# Patient Record
Sex: Female | Born: 1950
Health system: Southern US, Community
[De-identification: ages and names within clinical notes are randomized; demographics above are authoritative.]

## PROBLEM LIST (undated history)

## (undated) DIAGNOSIS — I700 Atherosclerosis of aorta: Secondary | ICD-10-CM

## (undated) DIAGNOSIS — I2510 Atherosclerotic heart disease of native coronary artery without angina pectoris: Secondary | ICD-10-CM

## (undated) DIAGNOSIS — G720 Drug-induced myopathy: Secondary | ICD-10-CM

## (undated) DIAGNOSIS — E785 Hyperlipidemia, unspecified: Secondary | ICD-10-CM

## (undated) DIAGNOSIS — I071 Rheumatic tricuspid insufficiency: Secondary | ICD-10-CM

## (undated) DIAGNOSIS — Z9889 Other specified postprocedural states: Secondary | ICD-10-CM

## (undated) DIAGNOSIS — I371 Nonrheumatic pulmonary valve insufficiency: Secondary | ICD-10-CM

## (undated) DIAGNOSIS — B001 Herpesviral vesicular dermatitis: Secondary | ICD-10-CM

## (undated) DIAGNOSIS — T466X5A Adverse effect of antihyperlipidemic and antiarteriosclerotic drugs, initial encounter: Secondary | ICD-10-CM

## (undated) DIAGNOSIS — R002 Palpitations: Secondary | ICD-10-CM

## (undated) DIAGNOSIS — I219 Acute myocardial infarction, unspecified: Secondary | ICD-10-CM

## (undated) DIAGNOSIS — R112 Nausea with vomiting, unspecified: Secondary | ICD-10-CM

## (undated) DIAGNOSIS — R053 Chronic cough: Secondary | ICD-10-CM

## (undated) DIAGNOSIS — H409 Unspecified glaucoma: Secondary | ICD-10-CM

## (undated) HISTORY — DX: Unspecified glaucoma: H40.9

## (undated) HISTORY — PX: OTHER SURGICAL HISTORY: SHX169

## (undated) HISTORY — PX: OOPHORECTOMY: SHX86

## (undated) HISTORY — PX: EYE SURGERY: SHX253

## (undated) HISTORY — PX: ABDOMINAL HYSTERECTOMY: SHX81

## (undated) HISTORY — DX: Herpesviral vesicular dermatitis: B00.1

## (undated) HISTORY — PX: CATARACT EXTRACTION, BILATERAL: SHX1313

## (undated) HISTORY — PX: TUBAL LIGATION: SHX77

## (undated) HISTORY — PX: CATARACT EXTRACTION W/ INTRAOCULAR LENS  IMPLANT, BILATERAL: SHX1307

## (undated) HISTORY — DX: Acute myocardial infarction, unspecified: I21.9

## (undated) HISTORY — PX: FRACTURE SURGERY: SHX138

---

## 2005-02-03 ENCOUNTER — Ambulatory Visit: Payer: Self-pay | Admitting: Family Medicine

## 2005-02-18 ENCOUNTER — Ambulatory Visit: Payer: Self-pay | Admitting: Family Medicine

## 2005-04-13 ENCOUNTER — Ambulatory Visit: Payer: Self-pay | Admitting: Family Medicine

## 2005-10-15 ENCOUNTER — Emergency Department: Payer: Self-pay | Admitting: Emergency Medicine

## 2005-10-15 ENCOUNTER — Other Ambulatory Visit: Payer: Self-pay

## 2005-10-16 ENCOUNTER — Ambulatory Visit: Payer: Self-pay | Admitting: Emergency Medicine

## 2006-03-12 ENCOUNTER — Ambulatory Visit: Payer: Self-pay | Admitting: Obstetrics and Gynecology

## 2007-03-15 ENCOUNTER — Ambulatory Visit: Payer: Self-pay | Admitting: Specialist

## 2007-05-25 ENCOUNTER — Ambulatory Visit: Payer: Self-pay | Admitting: Family Medicine

## 2008-05-31 ENCOUNTER — Ambulatory Visit: Payer: Self-pay | Admitting: Family Medicine

## 2008-07-26 ENCOUNTER — Ambulatory Visit: Payer: Self-pay | Admitting: Gastroenterology

## 2009-06-05 ENCOUNTER — Ambulatory Visit: Payer: Self-pay | Admitting: Family Medicine

## 2010-06-12 ENCOUNTER — Ambulatory Visit: Payer: Self-pay | Admitting: Family Medicine

## 2011-11-12 ENCOUNTER — Ambulatory Visit: Payer: Self-pay | Admitting: Family Medicine

## 2012-08-24 ENCOUNTER — Ambulatory Visit: Payer: Self-pay | Admitting: Obstetrics and Gynecology

## 2012-08-24 LAB — BASIC METABOLIC PANEL
BUN: 9 mg/dL (ref 7–18)
Calcium, Total: 9.4 mg/dL (ref 8.5–10.1)
Chloride: 107 mmol/L (ref 98–107)
Creatinine: 0.58 mg/dL — ABNORMAL LOW (ref 0.60–1.30)
EGFR (Non-African Amer.): >60
Glucose: 90 mg/dL (ref 65–99)
Potassium: 4.9 mmol/L (ref 3.5–5.1)
Sodium: 141 mmol/L (ref 136–145)

## 2012-08-24 LAB — CBC
HCT: 39.8 % (ref 35.0–47.0)
MCV: 92 fL (ref 80–100)
RBC: 4.31 10*6/uL (ref 3.80–5.20)
RDW: 12.9 % (ref 11.5–14.5)
WBC: 5.1 10*3/uL (ref 3.6–11.0)

## 2012-09-05 ENCOUNTER — Ambulatory Visit: Payer: Self-pay | Admitting: Obstetrics and Gynecology

## 2012-09-06 LAB — CREATININE, SERUM
Creatinine: 0.59 mg/dL — ABNORMAL LOW (ref 0.60–1.30)
EGFR (African American): >60
EGFR (Non-African Amer.): >60

## 2012-09-08 LAB — PATHOLOGY REPORT

## 2012-11-16 ENCOUNTER — Ambulatory Visit: Payer: Self-pay | Admitting: Family Medicine

## 2015-04-16 NOTE — Op Note (Signed)
PATIENT NAME:  Ashlee Mccarthy, Ashlee Mccarthy MR#:  [id number] DATE OF BIRTH:  1951/01/04  DATE OF PROCEDURE:  09/05/2012  PREOPERATIVE DIAGNOSES:  1. Uterine prolapse, second degree.  2. Cystocele, second-degree.  3. Rectocele mild.   POSTOPERATIVE DIAGNOSES:  1. Uterine prolapse, second degree.  2. Cystocele, second-degree.  3. Rectocele mild.   OPERATIVE PROCEDURES: Transvaginal hysterectomy, bilateral salpingo-oophorectomy with anterior colporrhaphy.   SURGEON: Alanda Slim. Ayriana Wix, MD  FIRST ASSISTANT: None.   ANESTHESIA: General endotracheal.   INDICATIONS: The patient is a 64 year old white female, para 2-0-0-2, with symptomatic pelvic relaxation who desires definitive surgery. She desires ovaries to be removed at the time of surgery as well.   FINDINGS AT SURGERY: Findings at surgery revealed the uterine prolapse and cystocele. There was a mild to moderate rectocele which was not repaired because the patient was asymptomatic. The patient's tubes and ovaries were grossly normal. There was a left pelvic infundibulopelvic bleeder that had to be controlled with additional suture.   DESCRIPTION OF PROCEDURE: The patient was brought to the Operating Room where she was placed in supine position. General endotracheal anesthesia was induced without difficulty. She was placed in the dorsal lithotomy position using the candy cane stirrups. A perineal, intravaginal prep and drape was performed in the standard fashion. Foley catheter was placed and was draining clear yellow urine from the bladder. A weighted speculum was placed into the vagina and double-tooth tenaculum was placed on the cervix. Posterior colpotomy was made with Mayo scissors. The uterosacral ligaments were clamped, cut, and stick tied using 0 Vicryl. These were tagged. Cervix was circumscribed and the bladder and vaginal mucosa were dissected off the lower uterine segment through sharp and blunt dissection. Eventually the anterior  cul-de-sac was entered. The cardinal broad ligament complexes were then clamped, cut, and stick tied sequentially. The level of the utero-ovarian ligaments, the pedicles were clamped and the uterus was removed from the operative field. The ovary and tubes were isolated with Babcock clamp and the curved Heaney clamp was used to crossclamp the infundibulopelvic ligament. The adnexal structures were excised. The infundibulopelvic ligament was doubly ligated using 0 Vicryl suture. First suture was a free tie followed by a stick tie. There was some persistent bleeding from the IP pedicle on the left and this required repeat suturing with 0 Vicryl figure-of-eight stitch. This provided adequate hemostasis. The peritoneum was then reapproximated using a pursestring stitch of 0 Vicryl. This was followed by the anterior colporrhaphy. Allis Adair clamps were used to retraction. The vaginal mucosa was incised in the midline and sequentially the perivesical fascia was dissected off the vaginal mucosa through sharp and blunt dissection. Once adequately mobilized, the cystocele was reduced using vertical mattress sutures of 0 Vicryl. The excess vaginal mucosa was trimmed and then reapproximated in the midline using 2-0 chromic simple interrupted sutures. Following completion of the repair the vagina was packed with Kerlix with Premarin cream. Patient was then awakened, extubated, and taken to recovery room in satisfactory condition. Estimated blood loss was 250 mL. IV fluids were 1400 mL. All instruments, needle and sponge counts were verified as correct. The patient did receive Ancef antibiotic prophylaxis.  ____________________________ Alanda Slim Amedio Bowlby, MD mad:cms D: 09/05/2012 14:29:05 ET T: 09/05/2012 16:22:42 ET JOB#: [id number]  cc: Hassell Done A. Ariam Mol, MD, <Dictator>  Alanda Slim Virgen Belland MD ELECTRONICALLY SIGNED 09/05/2012 16:41

## 2015-04-16 NOTE — H&P (Signed)
PATIENT NAME:  Ashlee Mccarthy, NEWSON MR#:  [id number] DATE OF BIRTH:  19-Jul-1951  DATE OF ADMISSION:  09/05/2012  PREOPERATIVE DIAGNOSES:  1. Uterine prolapse.  2. Cystocele.   HISTORY: Ashlee Mccarthy is a 64 year old white female, para 2-0-0-2, on no hormone replacement therapy, who presents for surgical management of symptomatic pelvic relaxation. Patient has uterine prolapse and cystocele and desires definitive surgery through [facility]. She also would like anterior colporrhaphy. She does have a mild posterior wall defect but she is asymptomatic and therefore this will not be corrected.   PAST MEDICAL HISTORY/CHRONIC ILLNESSES: Denied.   PAST SURGICAL HISTORY:  1. Wrist fracture repair 1999.  2. Bilateral tubal ligation 1980.  3. Dilation and curettage.   PAST OB HISTORY: Para 2-0-0-2, spontaneous vaginal delivery x2 with largest being 8 pounds, 1 ounce.   FAMILY HISTORY: Negative for cancer of the colon. There is ovarian cancer in a half sister. There is breast cancer in three maternal aunts. There is heart disease in several family members. There is no history of diabetes mellitus.   SOCIAL HISTORY: Patient does not smoke, does not drink, does not use drugs. She currently is a Regulatory affairs officer.   DRUG ALLERGIES: None.   CURRENT MEDICATIONS: Estrace cream vaginal biweekly.   REVIEW OF SYSTEMS: Patient denies recent illness. She denies history of coagulopathy. She denies reactive airway disease.   PHYSICAL EXAMINATION:  VITAL SIGNS: Height 64 inches, weight 139 pounds, body mass index 24, heart rate 73, blood pressure 152/84.   GENERAL: Patient is a pleasant well-appearing white female with normal affect. She is alert and oriented.   OROPHARYNX: Clear.   NECK: Supple. There is no thyromegaly or adenopathy.   LUNGS: Clear.   HEART: Regular rate and rhythm without murmur.   ABDOMEN: Soft, nontender. No organomegaly. No hernia.   PELVIC: External genitalia normal. BUS normal.  Vagina has fair estrogen effect. There is a second-degree cystocele present. There is mild uterine prolapse noted. Small rectocele is noted.  EXTREMITIES: Without clubbing, cyanosis, or edema.   SKIN: Without rash.   MUSCULOSKELETAL: Exam is normal.   IMPRESSION:  1. Second degree cystocele.  2. Uterine prolapse.  3. Mild rectocele.   PLAN: [facility], BSO with anterior colporrhaphy; date of surgery is 09/05/2012.    CONSENT NOTE: Patient is to undergo surgery for SPR on 09/05/2012. She is understanding of the planned procedure and is aware of and is accepting of all surgical risks which include but are not limited to bleeding, infection, pelvic organ injury with need for repair, blood clot disorders, and anesthesia risks . Informed consent is given. Patient is ready and willing to proceed with surgery as scheduled.  ____________________________ Alanda Slim Shaquoya Cosper, MD mad:cms D: 09/05/2012 11:59:48 ET T: 09/05/2012 12:20:11 ET JOB#: [id number]  cc: Hassell Done A. Marnita Poirier, MD, <Dictator>  Alanda Slim Ajanae Virag MD ELECTRONICALLY SIGNED 09/05/2012 16:41

## 2015-07-04 ENCOUNTER — Encounter: Payer: Self-pay | Admitting: Family Medicine

## 2016-03-02 ENCOUNTER — Ambulatory Visit: Payer: Self-pay | Admitting: Family Medicine

## 2016-03-12 ENCOUNTER — Ambulatory Visit: Payer: Self-pay | Admitting: Family Medicine

## 2016-09-16 ENCOUNTER — Encounter: Payer: Self-pay | Admitting: Family Medicine

## 2016-09-16 ENCOUNTER — Ambulatory Visit (INDEPENDENT_AMBULATORY_CARE_PROVIDER_SITE_OTHER): Payer: Self-pay | Admitting: Family Medicine

## 2016-09-16 ENCOUNTER — Other Ambulatory Visit: Payer: Self-pay | Admitting: Family Medicine

## 2016-09-16 VITALS — BP 118/78 | HR 91 | Temp 97.9°F | Resp 18 | Ht 64.0 in | Wt 144.2 lb

## 2016-09-16 DIAGNOSIS — R300 Dysuria: Secondary | ICD-10-CM

## 2016-09-16 DIAGNOSIS — R350 Frequency of micturition: Secondary | ICD-10-CM

## 2016-09-16 DIAGNOSIS — R42 Dizziness and giddiness: Secondary | ICD-10-CM

## 2016-09-16 DIAGNOSIS — Z23 Encounter for immunization: Secondary | ICD-10-CM | POA: Diagnosis not present

## 2016-09-16 DIAGNOSIS — Z90710 Acquired absence of both cervix and uterus: Secondary | ICD-10-CM | POA: Diagnosis not present

## 2016-09-16 DIAGNOSIS — E2839 Other primary ovarian failure: Secondary | ICD-10-CM | POA: Diagnosis not present

## 2016-09-16 DIAGNOSIS — Z1211 Encounter for screening for malignant neoplasm of colon: Secondary | ICD-10-CM

## 2016-09-16 DIAGNOSIS — B001 Herpesviral vesicular dermatitis: Secondary | ICD-10-CM | POA: Diagnosis not present

## 2016-09-16 DIAGNOSIS — Z13220 Encounter for screening for lipoid disorders: Secondary | ICD-10-CM

## 2016-09-16 DIAGNOSIS — Z1159 Encounter for screening for other viral diseases: Secondary | ICD-10-CM

## 2016-09-16 DIAGNOSIS — Z1239 Encounter for other screening for malignant neoplasm of breast: Secondary | ICD-10-CM

## 2016-09-16 DIAGNOSIS — F4321 Adjustment disorder with depressed mood: Secondary | ICD-10-CM | POA: Diagnosis not present

## 2016-09-16 DIAGNOSIS — R55 Syncope and collapse: Secondary | ICD-10-CM

## 2016-09-16 DIAGNOSIS — Z131 Encounter for screening for diabetes mellitus: Secondary | ICD-10-CM | POA: Diagnosis not present

## 2016-09-16 DIAGNOSIS — R232 Flushing: Secondary | ICD-10-CM

## 2016-09-16 DIAGNOSIS — N951 Menopausal and female climacteric states: Secondary | ICD-10-CM | POA: Diagnosis not present

## 2016-09-16 DIAGNOSIS — R5383 Other fatigue: Secondary | ICD-10-CM

## 2016-09-16 NOTE — Progress Notes (Signed)
Name: Ashlee Mccarthy   MRN: [id number]    DOB: January 06, 1951   Date:09/16/2016       Progress Note  Subjective  Chief Complaint  Chief Complaint  Patient presents with  . Annual Exam  . Urinary Tract Infection    for about a week    HPI  Urinary symptoms: she has noticed over the past few weeks intermittent symptoms of dysuria, urinary frequency and nocturia also hesitancy, improves with increase in water intake and also cranberry juice but not completely resolved.   Syncope: she has noticed four episodes of dizziness, lightheadedness, last episode happened at home in the evening. She only ate a bowel cereal for breakfast and that evening felt dizzy, sat down on her bed and briefly seemed to have lost consciousness ( based on time on her clock ), she recalls events prior, no weakness following episode , she ate and felt better, once at work after drinking a slushy. Not associated with spinning sensation or hearing changes. No loss of bowel or bladder control. Symptoms started after death of her husband 2.5 months ago  Grieving: husband of 8 years and he was a smoker and had PVD, he had a foot amputation and died after the procedure, she is still grieving, she has gained weight since he died, she still crying, she works and has family support locally. No problem sleeping at this time   Patient Active Problem List   Diagnosis Date Noted  . Fever blister 09/16/2016  . History of hysterectomy for benign disease 09/16/2016  . Menopause syndrome 09/16/2016    No past surgical history on file.  No family history on file.  Social History   Social History  . Marital status: Married    Spouse name: N/A  . Number of children: N/A  . Years of education: N/A   Occupational History  . Not on file.   Social History Main Topics  . Smoking status: Never Smoker  . Smokeless tobacco: Never Used  . Alcohol use No  . Drug use: No  . Sexual activity: No   Other Topics Concern  . Not on  file   Social History Narrative  . No narrative on file     Current Outpatient Prescriptions:  .  valACYclovir (VALTREX) 500 MG tablet, Take 500 mg by mouth 2 (two) times daily., Disp: , Rfl:   Allergies  Allergen Reactions  . Penicillins Rash     ROS  Ten systems reviewed and is negative except as mentioned in HPI   Objective  Vitals:   09/16/16 1400  BP: 118/78  Pulse: 91  Resp: 18  Temp: 97.9 F (36.6 C)  SpO2: 98%  Weight: 144 lb 4 oz (65.4 kg)  Height: 5\' 4"  (1.626 m)    Body mass index is 24.76 kg/m.  Physical Exam  Constitutional: Patient appears well-developed and well-nourished. Obese  No distress.  HEENT: head atraumatic, normocephalic, pupils equal and reactive to light, , neck supple, throat within normal limits Cardiovascular: Normal rate, regular rhythm and normal heart sounds.  No murmur heard. No BLE edema. Pulmonary/Chest: Effort normal and breath sounds normal. No respiratory distress. Abdominal: Soft.  There is no tenderness. Negative CVA tenderness Psychiatric: Patient has a normal mood and affect. behavior is normal. Judgment and thought content normal. Neurological: AAO, no focal findings, no nystagmus  PHQ2/9: Depression screen PHQ 2/9 09/16/2016  Decreased Interest 0  Down, Depressed, Hopeless 0  PHQ - 2 Score 0  Fall Risk: Fall Risk  09/16/2016  Falls in the past year? No     Functional Status Survey: Is the patient deaf or have difficulty hearing?: No Does the patient have difficulty seeing, even when wearing glasses/contacts?: No Does the patient have difficulty concentrating, remembering, or making decisions?: No Does the patient have difficulty walking or climbing stairs?: No Does the patient have difficulty dressing or bathing?: No Does the patient have difficulty doing errands alone such as visiting a doctor's office or shopping?: No    Assessment & Plan  1. Urinary frequency  - CULTURE, URINE  COMPREHENSIVE  2. Dysuria  - CULTURE, URINE COMPREHENSIVE  3. Lipid screening  - Lipid panel  4. Diabetes mellitus screening  - Hemoglobin A1c  5. Fever blister   6. History of hysterectomy for benign disease   7. Hot flashes  Check labs  8. Breast cancer screening  - MM Digital Screening; Future  9. Colon cancer screening  - Ambulatory referral to Gastroenterology  10. Grieving  Dicussed grieve counseling  37. Ovarian failure  - DG Bone Density; Future  12. Need for Tdap vaccination  - Tdap vaccine greater than or equal to 7yo IM  13. Needs flu shot  - Flu Vaccine QUAD 36+ mos IM  14. Need for hepatitis C screening test  - Hepatitis C antibody  15. Other fatigue  - CBC with Differential/Platelet - COMPLETE METABOLIC PANEL WITH GFR - TSH - Vitamin B12 - VITAMIN D 25 Hydroxy (Vit-D Deficiency, Fractures)  16. Dizziness  Explained importance of not skipping meals, we will check labs and if continues to occur refer to neurologist  17. Near syncope  - CBC with Differential/Platelet - COMPLETE METABOLIC PANEL WITH GFR - TSH - Vitamin B12 - VITAMIN D 25 Hydroxy (Vit-D Deficiency, Fractures)

## 2016-09-17 LAB — COMPLETE METABOLIC PANEL WITH GFR
ALT: 13 U/L (ref 6–29)
AST: 22 U/L (ref 10–35)
Albumin: 4.6 g/dL (ref 3.6–5.1)
Alkaline Phosphatase: 56 U/L (ref 33–130)
BUN: 10 mg/dL (ref 7–25)
CALCIUM: 9.8 mg/dL (ref 8.6–10.4)
CHLORIDE: 104 mmol/L (ref 98–110)
CO2: 26 mmol/L (ref 20–31)
CREATININE: 0.61 mg/dL (ref 0.50–0.99)
GFR, Est African American: >89 mL/min (ref >=60)
GFR, Est Non African American: >89 mL/min (ref >=60)
GLUCOSE: 77 mg/dL (ref 65–99)
POTASSIUM: 4.4 mmol/L (ref 3.5–5.3)
SODIUM: 140 mmol/L (ref 135–146)
Total Bilirubin: 1 mg/dL (ref 0.2–1.2)
Total Protein: 6.8 g/dL (ref 6.1–8.1)

## 2016-09-17 LAB — CBC WITH DIFFERENTIAL/PLATELET
BASOS ABS: 55 {cells}/uL (ref 0–200)
Basophils Relative: 1 %
EOS ABS: 220 {cells}/uL (ref 15–500)
EOS PCT: 4 %
HEMATOCRIT: 40.2 % (ref 35.0–45.0)
HEMOGLOBIN: 13.5 g/dL (ref 11.7–15.5)
LYMPHS ABS: 2090 {cells}/uL (ref 850–3900)
Lymphocytes Relative: 38 %
MCH: 31.1 pg (ref 27.0–33.0)
MCHC: 33.6 g/dL (ref 32.0–36.0)
MCV: 92.6 fL (ref 80.0–100.0)
MPV: 11.6 fL (ref 7.5–12.5)
Monocytes Absolute: 550 cells/uL (ref 200–950)
Monocytes Relative: 10 %
NEUTROS PCT: 47 %
Neutro Abs: 2585 cells/uL (ref 1500–7800)
Platelets: 261 10*3/uL (ref 140–400)
RBC: 4.34 MIL/uL (ref 3.80–5.10)
RDW: 13 % (ref 11.0–15.0)
WBC: 5.5 10*3/uL (ref 3.8–10.8)

## 2016-09-17 LAB — TSH: TSH: 0.87 m[IU]/L

## 2016-09-17 LAB — LIPID PANEL
CHOL/HDL RATIO: 2.6 ratio (ref <=5.0)
CHOLESTEROL: 192 mg/dL (ref 125–200)
HDL: 73 mg/dL (ref >=46)
LDL CALC: 113 mg/dL (ref <130)
Triglycerides: 28 mg/dL (ref <150)
VLDL: 6 mg/dL (ref <30)

## 2016-09-17 LAB — HEMOGLOBIN A1C
HEMOGLOBIN A1C: 5 % (ref <5.7)
Mean Plasma Glucose: 97 mg/dL

## 2016-09-17 LAB — HEPATITIS C ANTIBODY: HCV Ab: NEGATIVE

## 2016-09-17 LAB — VITAMIN B12: VITAMIN B 12: 307 pg/mL (ref 200–1100)

## 2016-09-17 LAB — VITAMIN D 25 HYDROXY (VIT D DEFICIENCY, FRACTURES): Vit D, 25-Hydroxy: 36 ng/mL (ref 30–100)

## 2016-09-19 LAB — CULTURE, URINE COMPREHENSIVE

## 2016-09-20 ENCOUNTER — Other Ambulatory Visit: Payer: Self-pay | Admitting: Family Medicine

## 2016-09-20 MED ORDER — NITROFURANTOIN MONOHYD MACRO 100 MG PO CAPS: 100.0000 mg | ORAL_CAPSULE | Freq: Two times a day (BID) | ORAL | 0 refills | Status: DC

## 2016-10-13 ENCOUNTER — Encounter: Payer: Self-pay | Admitting: Family Medicine

## 2016-10-13 ENCOUNTER — Ambulatory Visit (INDEPENDENT_AMBULATORY_CARE_PROVIDER_SITE_OTHER): Payer: PRIVATE HEALTH INSURANCE | Admitting: Family Medicine

## 2016-10-13 VITALS — BP 118/84 | HR 98 | Temp 98.1°F | Resp 16 | Ht 64.0 in | Wt 143.4 lb

## 2016-10-13 DIAGNOSIS — Z2911 Encounter for prophylactic immunotherapy for respiratory syncytial virus (RSV): Secondary | ICD-10-CM | POA: Diagnosis not present

## 2016-10-13 DIAGNOSIS — Z23 Encounter for immunization: Secondary | ICD-10-CM

## 2016-10-13 DIAGNOSIS — Z01419 Encounter for gynecological examination (general) (routine) without abnormal findings: Secondary | ICD-10-CM | POA: Diagnosis not present

## 2016-10-13 DIAGNOSIS — Z1211 Encounter for screening for malignant neoplasm of colon: Secondary | ICD-10-CM

## 2016-10-13 DIAGNOSIS — Z124 Encounter for screening for malignant neoplasm of cervix: Secondary | ICD-10-CM | POA: Diagnosis not present

## 2016-10-13 NOTE — Progress Notes (Signed)
Name: Ashlee Mccarthy   MRN: [id number]    DOB: [birth year]   Date:10/13/2016       Progress Note  Subjective  Chief Complaint  Chief Complaint  Patient presents with  . Annual Exam    HPI  Well Woman: she has family history of ovarian , colon and breast cancer, discussed hereditary cancer syndromes and she will think about it. No bladder symptoms, not sexually active ( widow )   Patient Active Problem List   Diagnosis Date Noted  . Fever blister 09/16/2016  . History of hysterectomy for benign disease 09/16/2016  . Menopause syndrome 09/16/2016    Past Surgical History:  Procedure Laterality Date  . ABDOMINAL HYSTERECTOMY    . bladder tack    . TUBAL LIGATION      Family History  Problem Relation Age of Onset  . Cirrhosis Mother   . Lung cancer Father   . Ovarian cancer Sister   . Colon cancer Maternal Aunt   . Colon cancer Maternal Uncle     Social History   Social History  . Marital status: Married    Spouse name: N/A  . Number of children: N/A  . Years of education: N/A   Occupational History  . Not on file.   Social History Main Topics  . Smoking status: Never Smoker  . Smokeless tobacco: Never Used  . Alcohol use No  . Drug use: No  . Sexual activity: No   Other Topics Concern  . 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     Current Outpatient Prescriptions:  .  valACYclovir (VALTREX) 500 MG tablet, Take 500 mg by mouth 2 (two) times daily., Disp: , Rfl:   Allergies  Allergen Reactions  . Penicillins Rash     ROS   Constitutional: Negative for fever or weight change.  Respiratory: Negative for cough and shortness of breath.   Cardiovascular: Negative for chest pain or palpitations.  Gastrointestinal: Negative for abdominal pain, no  bowel changes.  Musculoskeletal: Negative for gait problem or joint swelling.  Skin: Negative for rash.  Neurological: Negative for dizziness or headache.  No other specific complaints in a complete review of systems (except as listed in HPI above).   Objective  Vitals:   10/13/16 1147  BP: 118/84  Pulse: 98  Resp: 16  Temp: 98.1 F (36.7 C)  SpO2: 97%  Weight: 143 lb 7 oz (65.1 kg)  Height: 5' 4" (1.626 m)    Body mass index is 24.62 kg/m.  Physical Exam  Constitutional: Patient appears well-developed and well-nourished. No distress.  HENT: Head: Normocephalic and atraumatic. Ears: B TMs ok, no erythema or effusion; Nose: Nose normal. Mouth/Throat: Oropharynx is clear and moist. No oropharyngeal exudate.  Eyes: Conjunctivae and EOM are normal. Pupils are equal, round, and reactive to light. No scleral icterus.  Neck: Normal range of motion. Neck supple. No JVD present. No thyromegaly present.  Cardiovascular: Normal rate, regular rhythm and normal heart sounds.  No murmur heard. No BLE edema. Pulmonary/Chest: Effort normal and breath sounds normal. No respiratory distress. Abdominal: Soft. Bowel sounds are normal, no distension. There is no tenderness. no masses Breast: no lumps or masses, no nipple discharge or rashes FEMALE GENITALIA:  External genitalia normal  External urethra normal Vaginal vault with mild atrophy, without discharge or lesions Cervix absent Bimanual exam normal without masses RECTAL: normal external exam Musculoskeletal: Normal range of motion, no joint effusions. No gross deformities Neurological: he is alert and oriented to person, place, and time. No cranial nerve deficit. Coordination, balance, strength, speech and gait are normal.  Skin: Skin is warm and dry. No rash noted. No erythema.  Psychiatric: Patient has a normal mood and affect. behavior is normal. Judgment and thought content normal.  Recent Results (from the past 2160 hour(s))   COMPLETE METABOLIC PANEL WITH GFR     Status: None   Collection Time: 09/16/16 12:01 AM  Result Value Ref Range   Sodium 140 135 - 146 mmol/L   Potassium 4.4 3.5 - 5.3 mmol/L   Chloride 104 98 - 110 mmol/L   CO2 26 20 - 31 mmol/L   Glucose, Bld 77 65 - 99 mg/dL   BUN 10 7 - 25 mg/dL   Creat 0.61 0.50 - 0.99 mg/dL    Comment:   For patients > or = 63 years of age: The upper reference limit for Creatinine is approximately 13% higher for people identified as African-American.      Total Bilirubin 1.0 0.2 - 1.2 mg/dL   Alkaline Phosphatase 56 33 - 130 U/L   AST 22 10 - 35 U/L   ALT 13 6 - 29 U/L   Total Protein 6.8 6.1 - 8.1 g/dL   Albumin 4.6 3.6 - 5.1 g/dL   Calcium 9.8 8.6 - 10.4 mg/dL   GFR, Est African American >89 >=60 mL/min   GFR, Est Non African American >89 >=60 mL/min  CBC with Differential/Platelet     Status: None   Collection Time: 09/16/16 12:01 AM  Result Value Ref Range   WBC 5.5 3.8 - 10.8 K/uL   RBC 4.34 3.80 - 5.10 MIL/uL   Hemoglobin 13.5 11.7 - 15.5 g/dL   HCT 40.2 35.0 - 45.0 %   MCV 92.6 80.0 - 100.0 fL   MCH 31.1 27.0 - 33.0 pg   MCHC 33.6 32.0 - 36.0 g/dL   RDW 13.0 11.0 - 15.0 %   Platelets 261 140 - 400 K/uL   MPV 11.6 7.5 - 12.5 fL   Neutro Abs 2,585 1,500 - 7,800 cells/uL   Lymphs Abs 2,090 850 - 3,900 cells/uL   Monocytes Absolute 550 200 - 950 cells/uL   Eosinophils Absolute 220 15 - 500 cells/uL   Basophils Absolute 55 0 - 200 cells/uL   Neutrophils Relative % 47 %   Lymphocytes Relative 38 %   Monocytes Relative 10 %   Eosinophils Relative 4 %   Basophils Relative 1 %   Smear Review Criteria for review not met   Lipid panel     Status: None   Collection Time: 09/16/16 12:01 AM  Result Value Ref Range   Cholesterol 192 125 - 200 mg/dL   Triglycerides 28 <150 mg/dL   HDL 73 >=46 mg/dL   Total CHOL/HDL Ratio 2.6 <=5.0 Ratio   VLDL 6 <30 mg/dL   LDL Cholesterol 113 <130 mg/dL    Comment:   Total Cholesterol/HDL Ratio:CHD Risk                         Coronary Heart Disease Risk Table  Men       Women          1/2 Average Risk              3.4        3.3              Average Risk              5.0        4.4           2X Average Risk              9.6        7.1           3X Average Risk             23.4       11.0 Use the calculated Patient Ratio above and the CHD Risk table  to determine the patient's CHD Risk.   TSH     Status: None   Collection Time: 09/16/16 12:01 AM  Result Value Ref Range   TSH 0.87 mIU/L    Comment:   Reference Range   > or = 20 Years  0.40-4.50   Pregnancy Range First trimester  0.26-2.66 Second trimester 0.55-2.73 Third trimester  0.43-2.91     Vitamin B12     Status: None   Collection Time: 09/16/16 12:01 AM  Result Value Ref Range   Vitamin B-12 307 200 - 1,100 pg/mL  Hemoglobin A1c     Status: None   Collection Time: 09/16/16 12:01 AM  Result Value Ref Range   Hgb A1c MFr Bld 5.0 <5.7 %    Comment:   For the purpose of screening for the presence of diabetes:   <5.7%       Consistent with the absence of diabetes 5.7-6.4 %   Consistent with increased risk for diabetes (prediabetes) >=6.5 %     Consistent with diabetes   This assay result is consistent with a decreased risk of diabetes.   Currently, no consensus exists regarding use of hemoglobin A1c for diagnosis of diabetes in children.   According to American Diabetes Association (ADA) guidelines, hemoglobin A1c <7.0% represents optimal control in non-pregnant diabetic patients. Different metrics may apply to specific patient populations. Standards of Medical Care in Diabetes (ADA).      Mean Plasma Glucose 97 mg/dL  Hepatitis C antibody     Status: None   Collection Time: 09/16/16 12:01 AM  Result Value Ref Range   HCV Ab NEGATIVE NEGATIVE    Comment: NR=NOT REPORTABLE,SEE COMMENT ORAL therapy:A cefazolin MIC of <32 predicts  susceptibility to the oral agents  cefaclor, cefdinir,cefpodoxime,cefprozil,cefuroxime, cephalexin,and loracarbef when used for therapy  of uncomplicated UTIs due to E.coli,K.pneumomiae, and P.mirabilis. PARENTERAL therapy: A cefazolin MIC of >8 indicates resistance to parenteral cefazolin. An alternate test method must be performed to confirm susceptibility to parenteral cefazolin.   CULTURE, URINE COMPREHENSIVE     Status: None   Collection Time: 09/16/16 12:01 AM  Result Value Ref Range   Colony Count 10,000-50,000 CFU/mL    Organism ID, Bacteria ESCHERICHIA COLI       Susceptibility   Escherichia coli -  (no method available)    AMPICILLIN <=2 Sensitive     AMOX/CLAVULANIC <=2 Sensitive     AMPICILLIN/SULBACTAM <=2 Sensitive     PIP/TAZO <=4 Sensitive     IMIPENEM <=0.25 Sensitive     CEFAZOLIN <=4 Not Reportable  CEFTRIAXONE <=1 Sensitive     CEFTAZIDIME <=1 Sensitive     CEFEPIME <=1 Sensitive     GENTAMICIN <=1 Sensitive     TOBRAMYCIN <=1 Sensitive     CIPROFLOXACIN <=0.25 Sensitive     LEVOFLOXACIN <=0.12 Sensitive     NITROFURANTOIN <=16 Sensitive     TRIMETH/SULFA <=20 Sensitive   VITAMIN D 25 Hydroxy (Vit-D Deficiency, Fractures)     Status: None   Collection Time: 09/16/16 12:01 AM  Result Value Ref Range   Vit D, 25-Hydroxy 36 30 - 100 ng/mL    Comment: Vitamin D Status           25-OH Vitamin D        Deficiency                <20 ng/mL        Insufficiency         20 - 29 ng/mL        Optimal             > or = 30 ng/mL   For 25-OH Vitamin D testing on patients on D2-supplementation and patients for whom quantitation of D2 and D3 fractions is required, the [facility] 25-OH VIT D, (D2,D3), LC/MS/MS is recommended: order code [id number] (patients > 2 yrs).     PHQ2/9: Depression screen [facility] 2/9 10/13/2016 09/16/2016  Decreased Interest 0 0  Down, Depressed, Hopeless 0 0  PHQ - 2 Score 0 0     Fall Risk: Fall Risk  10/13/2016 09/16/2016  Falls in the past year? No No      Functional Status Survey: Is the patient deaf or have difficulty hearing?: No Does the patient have difficulty seeing, even when wearing glasses/contacts?: No Does the patient have difficulty concentrating, remembering, or making decisions?: No Does the patient have difficulty walking or climbing stairs?: No Does the patient have difficulty dressing or bathing?: No Does the patient have difficulty doing errands alone such as visiting a doctor's office or shopping?: No    Assessment & Plan  1. Well woman exam  Discussed hereditary cancer syndromes, she will find out about coverage with her insurance and also think if she wants to have it done  2. Cervical cancer screening  No need, absent cervix  3. Colon cancer screening  Referral already done  4. Need for shingles vaccine  - Varicella-zoster vaccine subcutaneous

## 2016-10-19 ENCOUNTER — Ambulatory Visit: Payer: PRIVATE HEALTH INSURANCE | Admitting: Family Medicine

## 2016-11-23 ENCOUNTER — Ambulatory Visit
Admission: RE | Admit: 2016-11-23 | Discharge: 2016-11-23 | Disposition: A | Discharge status: Home / Self Care | Payer: 59 | Source: Ambulatory Visit | Attending: Family Medicine | Admitting: Family Medicine

## 2016-11-23 DIAGNOSIS — M810 Age-related osteoporosis without current pathological fracture: Secondary | ICD-10-CM | POA: Diagnosis not present

## 2016-11-23 DIAGNOSIS — Z1231 Encounter for screening mammogram for malignant neoplasm of breast: Secondary | ICD-10-CM | POA: Diagnosis not present

## 2016-11-23 DIAGNOSIS — Z780 Asymptomatic menopausal state: Secondary | ICD-10-CM | POA: Diagnosis not present

## 2016-11-23 DIAGNOSIS — E2839 Other primary ovarian failure: Secondary | ICD-10-CM | POA: Insufficient documentation

## 2016-11-25 ENCOUNTER — Encounter: Payer: Self-pay | Admitting: Family Medicine

## 2016-11-25 DIAGNOSIS — M810 Age-related osteoporosis without current pathological fracture: Secondary | ICD-10-CM | POA: Insufficient documentation

## 2016-12-15 ENCOUNTER — Ambulatory Visit: Payer: PRIVATE HEALTH INSURANCE | Admitting: Family Medicine

## 2017-09-19 IMAGING — MG MM DIGITAL SCREENING BILAT W/ CAD
4 series · 4 of 4 positions shown · non-contrast
Comparison: Previous exam(s).

CLINICAL DATA: Screening.

EXAM:
DIGITAL SCREENING BILATERAL MAMMOGRAM WITH CAD

[R CC]
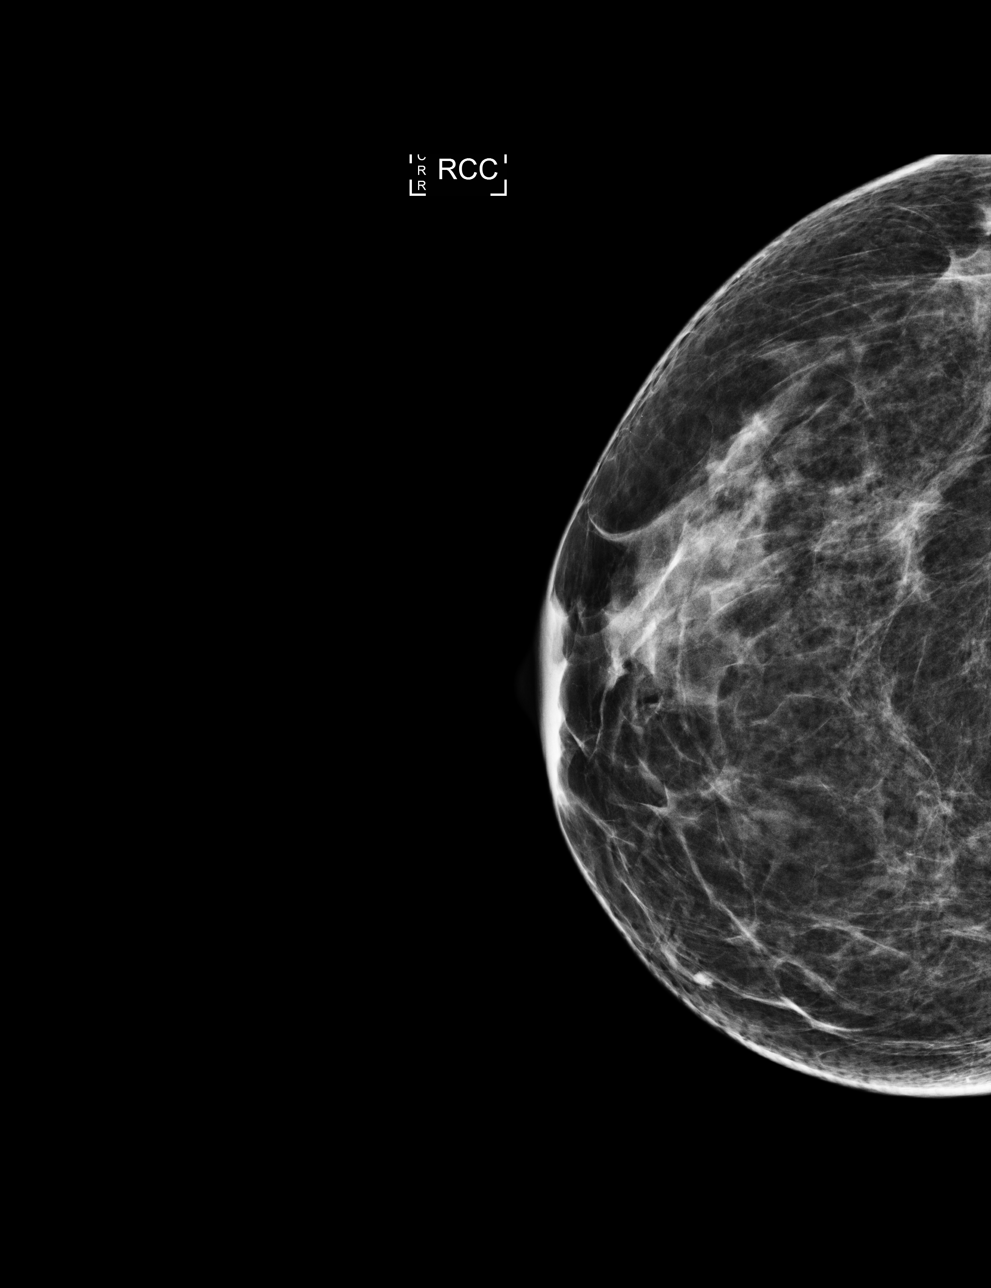

[L MLO]
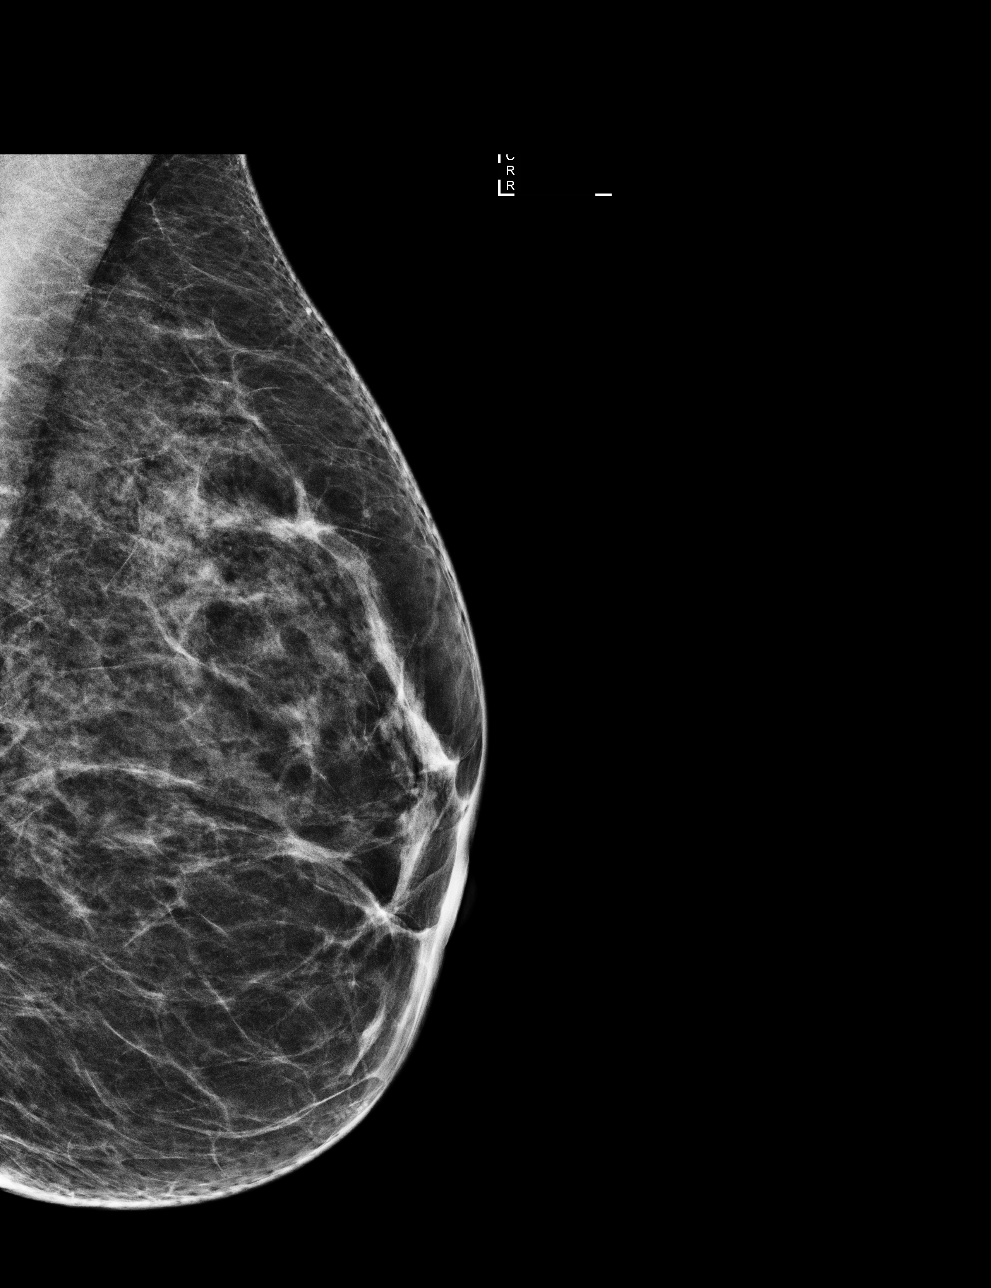

[L CC]
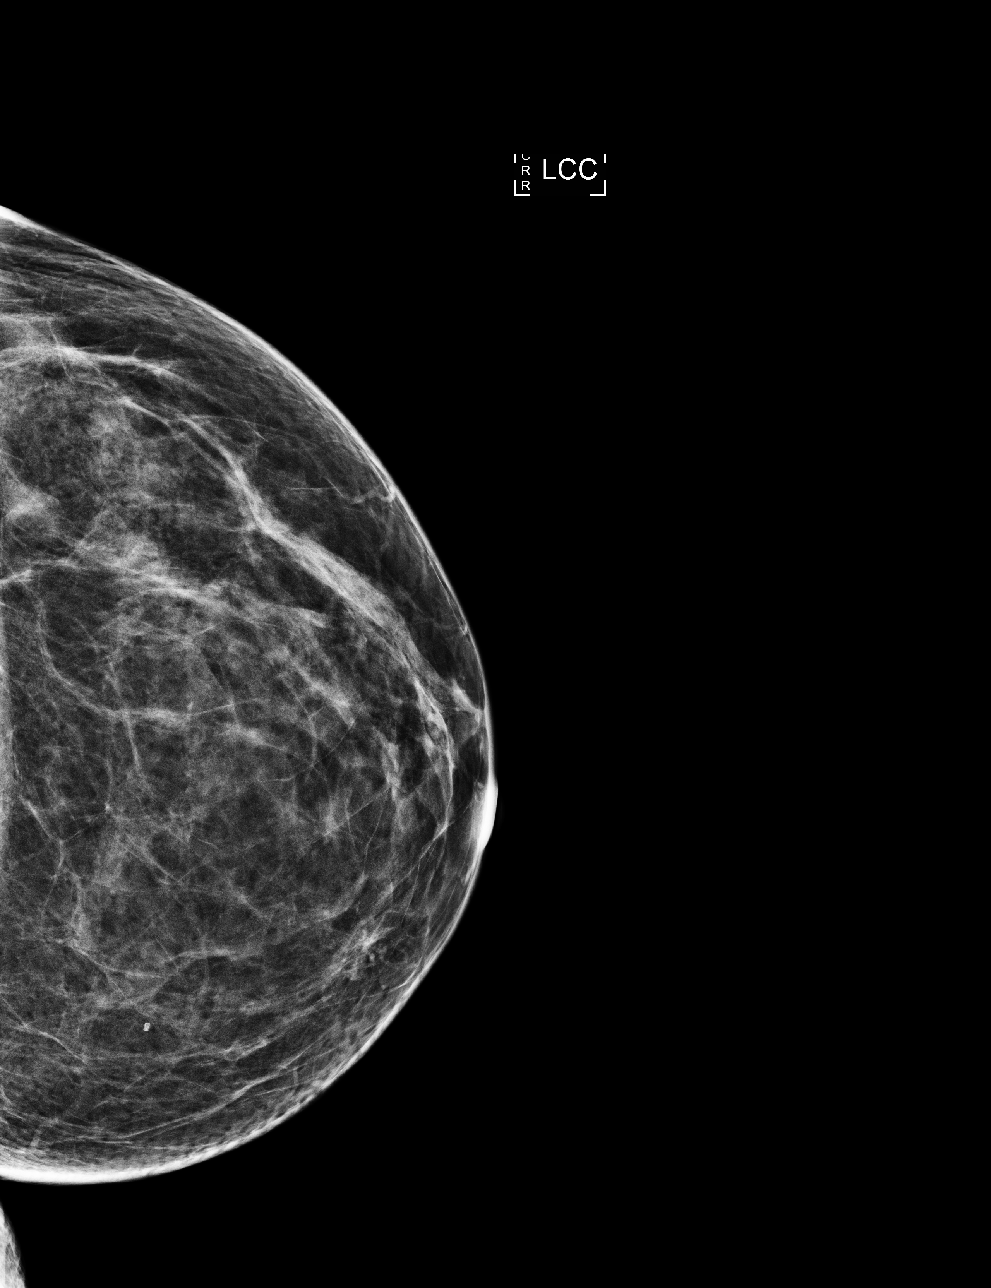

[R MLO]
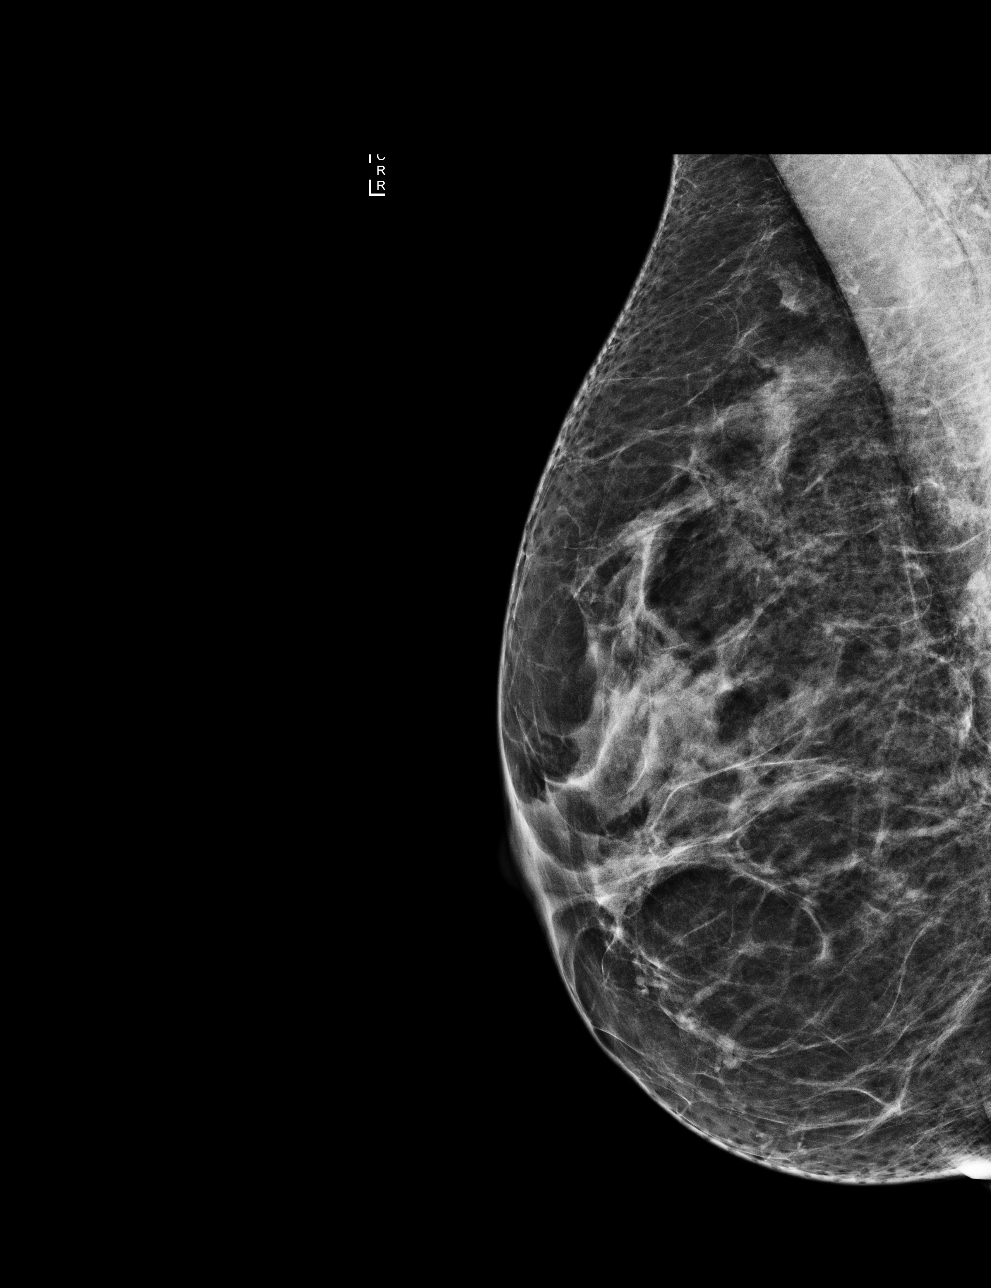

[4 of 4 positions shown; findings below may reference images not displayed]

ACR Breast Density Category b: There are scattered areas of
fibroglandular density.
FINDINGS: There are no findings suspicious for malignancy. Images were
processed with CAD.
IMPRESSION: No mammographic evidence of malignancy. A result letter of this
screening mammogram will be mailed directly to the patient.

RECOMMENDATION:
Screening mammogram in one year. (Code:AS-G-LCT)

BI-RADS CATEGORY  1: Negative.

## 2017-09-27 ENCOUNTER — Ambulatory Visit (INDEPENDENT_AMBULATORY_CARE_PROVIDER_SITE_OTHER): Payer: PRIVATE HEALTH INSURANCE | Admitting: Family Medicine

## 2017-09-27 ENCOUNTER — Encounter: Payer: Self-pay | Admitting: Family Medicine

## 2017-09-27 VITALS — BP 122/70 | HR 96 | Temp 98.1°F | Resp 16 | Ht 64.0 in | Wt 143.8 lb

## 2017-09-27 DIAGNOSIS — Z114 Encounter for screening for human immunodeficiency virus [HIV]: Secondary | ICD-10-CM

## 2017-09-27 DIAGNOSIS — Z1231 Encounter for screening mammogram for malignant neoplasm of breast: Secondary | ICD-10-CM

## 2017-09-27 DIAGNOSIS — Z13220 Encounter for screening for lipoid disorders: Secondary | ICD-10-CM

## 2017-09-27 DIAGNOSIS — M810 Age-related osteoporosis without current pathological fracture: Secondary | ICD-10-CM

## 2017-09-27 DIAGNOSIS — Z1239 Encounter for other screening for malignant neoplasm of breast: Secondary | ICD-10-CM

## 2017-09-27 DIAGNOSIS — Z0000 Encounter for general adult medical examination without abnormal findings: Secondary | ICD-10-CM

## 2017-09-27 MED ORDER — VITAMIN D 1000 UNITS PO TABS: 1000.0000 [IU] | ORAL_TABLET | Freq: Every day | ORAL | 0 refills | Status: DC

## 2017-09-27 NOTE — Progress Notes (Addendum)
Name: Ashlee Mccarthy   MRN: [id number]    DOB: 1951/05/08   Date:09/27/2017       Progress Note  Subjective  Chief Complaint  Chief Complaint  Patient presents with  . Annual Exam    HPI  Patient presents for annual CPE - she has been doing well and is not taking any medications at this time.  Diet: Skips breakfast or has cereal/oatmeal, lunch is usually grilled chicken salad, dinner usually late at night and green beans, grilled chicken, salad, etc.  She does eat ice cream or or chocolate at night before bed. Exercise:  Rides the bicycle and lifts light weights at the gym >3-4 times a week.  Occupation: Works at [employer] part time, [employer] daily.  USPSTF grade A and B recommendations  Depression: Husband passed away 1.5 years ago, stays busy, has adult children and grandchildren that keep her occupied.  Sundays are the hardest days, she does not want counseling. Depression screen [facility] 2/9 10/13/2016 09/16/2016  Decreased Interest 0 0  Down, Depressed, Hopeless 0 0  PHQ - 2 Score 0 0   Hypertension: BP Readings from Last 3 Encounters:  09/27/17 122/70  10/13/16 118/84  09/16/16 118/78   Obesity: Wt Readings from Last 3 Encounters:  09/27/17 143 lb 12.8 oz (65.2 kg)  10/13/16 143 lb 7 oz (65.1 kg)  09/16/16 144 lb 4 oz (65.4 kg)   BMI Readings from Last 3 Encounters:  09/27/17 24.68 kg/m  10/13/16 24.62 kg/m  09/16/16 24.76 kg/m    Alcohol: Hardly ever - has not drank in over 1.5 years Tobacco use: Never ser HIV, hep C: Hep C negative; will check HIV today STD testing and prevention (chl/gon/syphilis): Declines; she does note that she has been sexually active but does not want testing. Intimate partner violence: No concerns Sexual History/Pain during Intercourse: No pain or bleeding with intercourse. Menstrual History/LMP: Hysterectomy Incontinence Symptoms: None.  Advanced Care Planning: A voluntary discussion about advance care planning including the  explanation and discussion of advance directives.  Discussed health care proxy and Living will, and the patient was able to identify a health care proxy as [place].  Patient does not have a living will at present time. If patient does have living will, I have requested they bring this to the clinic to be scanned in to their chart.  Breast cancer: Mammogram due after 11/24/2017, order placed.  3 Aunts on Mother's side have had breast cancer in their 40's and 60's.  Pt's mother and sisters have not had breast cancer No results found for: [facility]  BRCA gene screening: No genetic testing was performed/ Cervical cancer screening: Hysterectomy   Osteoporosis: Was diagnosed at osteoporosis 2017, but never returned to discuss results - she states that she is not interested in medications at this time though this is stressed as important to rebuild bone mass. She is willing to have additional labs drawn today.  Does ride the bicycle and lifts light weights at the gym >3-4 times a week. Takes vitamin D Supplement daily  Mother and two sisters with osteoporosis. No results found for: HMDEXASCAN  Fall prevention/vitamin D: Taking Vitamin D Supplementation; Has railing in all staircases, no loose throw rugs, good lighting throughout the home.  Lipids:  Lab Results  Component Value Date   CHOL 192 09/16/2016   Lab Results  Component Value Date   HDL 73 09/16/2016   Lab Results  Component Value Date   LDLCALC 113 09/16/2016  Lab Results  Component Value Date   TRIG 28 09/16/2016   Lab Results  Component Value Date   CHOLHDL 2.6 09/16/2016   No results found for: LDLDIRECT  Glucose:  Glucose  Date Value Ref Range Status  08/24/2012 90 65 - 99 mg/dL Final   Glucose, Bld  Date Value Ref Range Status  09/16/2016 77 65 - 99 mg/dL Final    Skin cancer: Has appt with Derm in November for skin survey Colorectal cancer: July 2009 was last colonoscopy with Dr. Donnella Sham, pt was referred back  in 2017, and their office stated that they would call when she was due again. Lung cancer:  Does not qualify. Low Dose CT Chest recommended if Age 22-80 years, 30 pack-year currently smoking OR have quit w/in 15years. Patient does not qualify.   Aspirin: Not indicated at this time. ECG: Baseline on file.  Vaccinations: Pt decliens flu shot today because she is just getting over a cold, would like to return in a week or two.  Patient Active Problem List   Diagnosis Date Noted  . Osteoporosis 11/25/2016  . Fever blister 09/16/2016  . History of hysterectomy for benign disease 09/16/2016  . Menopause syndrome 09/16/2016    Past Surgical History:  Procedure Laterality Date  . ABDOMINAL HYSTERECTOMY    . bladder tack    . TUBAL LIGATION      Family History  Problem Relation Age of Onset  . Cirrhosis Mother   . Lung cancer Father   . Ovarian cancer Sister   . Colon cancer Maternal Aunt   . Breast cancer Maternal Aunt        3 mat aunts  . Colon cancer Maternal Uncle     Social History   Social History  . Marital status: Widowed    Spouse name: N/A  . Number of children: N/A  . Years of education: N/A   Occupational History  . Not on file.   Social History Main Topics  . Smoking status: Never Smoker  . Smokeless tobacco: Never Used  . Alcohol use No  . Drug use: No  . Sexual activity: No   Other Topics Concern  . 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     Current Outpatient Prescriptions:  .  valACYclovir (VALTREX) 500 MG tablet, Take 500 mg by mouth 2 (two) times daily., Disp: , Rfl:   Allergies  Allergen Reactions  . Penicillins Rash     ROS  Constitutional: Negative for fever or weight change.  Respiratory: Negative for cough and shortness of  breath.   Cardiovascular: Negative for chest pain or palpitations.  Gastrointestinal: Negative for abdominal pain, no bowel changes.  Musculoskeletal: Negative for gait problem or joint swelling.  Skin: Negative for rash.  Neurological: Negative for dizziness or headache.  No other specific complaints in a complete review of systems (except as listed in HPI above).  Objective  Vitals:   09/27/17 1049  BP: 122/70  Pulse: 96  Resp: 16  Temp: 98.1 F (36.7 C)  TempSrc: Oral  SpO2: 98%  Weight: 143 lb 12.8 oz (65.2 kg)  Height: 5' 4" (1.626 m)    Body mass index is 24.68 kg/m.  Physical Exam  Constitutional: Patient  appears well-developed and well-nourished. No distress.  HENT: Head: Normocephalic and atraumatic. Ears: B TMs ok, no erythema or effusion; Nose: Nose normal. Mouth/Throat: Oropharynx is clear and moist; Mallampati Score - 2. No oropharyngeal exudate.  Eyes: Conjunctivae and EOM are normal. Pupils are equal, round, and reactive to light. No scleral icterus.  Neck: Normal range of motion. Neck supple. No JVD present. No thyromegaly present.  Cardiovascular: Normal rate, regular rhythm and normal heart sounds.  No murmur heard. No BLE edema. Pulmonary/Chest: Effort normal and breath sounds normal. No respiratory distress. Abdominal: Soft. Bowel sounds are normal, no distension. There is no tenderness. no masses Breast: no lumps or masses, no nipple discharge or rashes Musculoskeletal: Normal range of motion, no joint effusions. No gross deformities Neurological: he is alert and oriented to person, place, and time. No cranial nerve deficit. Coordination, balance, strength, speech and gait are normal.  Skin: Skin is warm and dry. No rash noted. No erythema.  Psychiatric: Patient has a normal mood and affect. behavior is normal. Judgment and thought content normal.  No results found for this or any previous visit (from the past 2160 hour(s)).  PHQ2/9: Depression screen  [facility] 2/9 10/13/2016 09/16/2016  Decreased Interest 0 0  Down, Depressed, Hopeless 0 0  PHQ - 2 Score 0 0   Fall Risk: Fall Risk  10/13/2016 09/16/2016  Falls in the past year? No No   Assessment & Plan  1. Encounter for annual physical exam -USPSTF grade A and B recommendations reviewed with patient; age-appropriate recommendations, preventive care, screening tests, etc discussed and encouraged; healthy living encouraged; see AVS for patient education given to patient -Discussed importance of 150 minutes of physical activity weekly, eat two servings of fish weekly, eat one serving of tree nuts ( cashews, pistachios, pecans, almonds.Marland Kitchen) every other day, eat 6 servings of fruit/vegetables daily and drink plenty of water and avoid sweet beverages.   2. Osteoporosis without current pathological fracture, unspecified osteoporosis type - TSH - CBC w/Diff/Platelet - COMPLETE METABOLIC PANEL WITH GFR - PTH, Intact and Calcium - Vitamin D (25 hydroxy) - cholecalciferol (VITAMIN D) 1000 units tablet; Take 1 tablet (1,000 Units total) by mouth daily.  Dispense: 30 tablet; Refill: 0  3. Encounter for screening for HIV - HIV antibody  I have reviewed this encounter including the documentation in this note and/or discussed this patient with the Johney Maine, FNP, NP-C. I am certifying that I agree with the content of this note as supervising physician.  Steele Sizer, MD [facility] 09/27/2017, 1:18 PM 4. Breast cancer screening - MM DIGITAL SCREENING BILATERAL; Future  5. Lipid screening - Lipid panel

## 2017-09-27 NOTE — Patient Instructions (Addendum)
[facility] - Injection medication for rebuilding bone mass when you have osteoporosis. Osteoporosis Osteoporosis happens when your bones become thinner and weaker. Weak bones can break (fracture) more easily when you slip or fall. Bones most at risk of breaking are in the hip, wrist, and spine. Follow these instructions at home:  Get enough calcium and vitamin D. These nutrients are good for your bones.  Exercise as told by your doctor.  Do not use any tobacco products. This includes cigarettes, chewing tobacco, and electronic cigarettes. If you need help quitting, ask your doctor.  Limit the amount of alcohol you drink.  Take medicines only as told by your doctor.  Keep all follow-up visits as told by your doctor. This is important.  Take care at home to prevent falls. Some ways to do this are: ? Keep rooms well lit and tidy. ? Put safety rails on your stairs. ? Put a rubber mat in the bathroom and other places that are often wet or slippery. Get help right away if:  You fall.  You hurt yourself. This information is not intended to replace advice given to you by your health care provider. Make sure you discuss any questions you have with your health care provider. Document Released: 03/07/2012 Document Revised: 05/21/2016 Document Reviewed: 05/24/2014 Elsevier Interactive Patient Education  2018 Ada Years, Female Preventive care refers to lifestyle choices and visits with your health care provider that can promote health and wellness. What does preventive care include?  A yearly physical exam. This is also called an annual well check.  Dental exams once or twice a year.  Routine eye exams. Ask your health care provider how often you should have your eyes checked.  Personal lifestyle choices, including: ? Daily care of your teeth and gums. ? Regular physical activity. ? Eating a healthy diet. ? Avoiding tobacco and drug use. ? Limiting  alcohol use. ? Practicing safe sex. ? Taking low-dose aspirin daily starting at age 54. ? Taking vitamin and mineral supplements as recommended by your health care provider. What happens during an annual well check? The services and screenings done by your health care provider during your annual well check will depend on your age, overall health, lifestyle risk factors, and family history of disease. Counseling Your health care provider may ask you questions about your:  Alcohol use.  Tobacco use.  Drug use.  Emotional well-being.  Home and relationship well-being.  Sexual activity.  Eating habits.  Work and work Statistician.  Method of birth control.  Menstrual cycle.  Pregnancy history.  Screening You may have the following tests or measurements:  Height, weight, and BMI.  Blood pressure.  Lipid and cholesterol levels. These may be checked every 5 years, or more frequently if you are over 93 years old.  Skin check.  Lung cancer screening. You may have this screening every year starting at age 24 if you have a 30-pack-year history of smoking and currently smoke or have quit within the past 15 years.  Fecal occult blood test (FOBT) of the stool. You may have this test every year starting at age 51.  Flexible sigmoidoscopy or colonoscopy. You may have a sigmoidoscopy every 5 years or a colonoscopy every 10 years starting at age 20.  Hepatitis C blood test.  Hepatitis B blood test.  Sexually transmitted disease (STD) testing.  Diabetes screening. This is done by checking your blood sugar (glucose) after you have not eaten for a while (fasting).  You may have this done every 1-3 years.  Mammogram. This may be done every 1-2 years. Talk to your health care provider about when you should start having regular mammograms. This may depend on whether you have a family history of breast cancer.  BRCA-related cancer screening. This may be done if you have a family  history of breast, ovarian, tubal, or peritoneal cancers.  Pelvic exam and Pap test. This may be done every 3 years starting at age 72. Starting at age 28, this may be done every 5 years if you have a Pap test in combination with an HPV test.  Bone density scan. This is done to screen for osteoporosis. You may have this scan if you are at high risk for osteoporosis.  Discuss your test results, treatment options, and if necessary, the need for more tests with your health care provider. Vaccines Your health care provider may recommend certain vaccines, such as:  Influenza vaccine. This is recommended every year.  Tetanus, diphtheria, and acellular pertussis (Tdap, Td) vaccine. You may need a Td booster every 10 years.  Varicella vaccine. You may need this if you have not been vaccinated.  Zoster vaccine. You may need this after age 82.  Measles, mumps, and rubella (MMR) vaccine. You may need at least one dose of MMR if you were born in 1957 or later. You may also need a second dose.  Pneumococcal 13-valent conjugate (PCV13) vaccine. You may need this if you have certain conditions and were not previously vaccinated.  Pneumococcal polysaccharide (PPSV23) vaccine. You may need one or two doses if you smoke cigarettes or if you have certain conditions.  Meningococcal vaccine. You may need this if you have certain conditions.  Hepatitis A vaccine. You may need this if you have certain conditions or if you travel or work in places where you may be exposed to hepatitis A.  Hepatitis B vaccine. You may need this if you have certain conditions or if you travel or work in places where you may be exposed to hepatitis B.  Haemophilus influenzae type b (Hib) vaccine. You may need this if you have certain conditions.  Talk to your health care provider about which screenings and vaccines you need and how often you need them. This information is not intended to replace advice given to you by your  health care provider. Make sure you discuss any questions you have with your health care provider. Document Released: 01/10/2016 Document Revised: 09/02/2016 Document Reviewed: 10/15/2015 Elsevier Interactive Patient Education  2017 [employer].

## 2017-09-28 LAB — LIPID PANEL
CHOL/HDL RATIO: 3.3 (calc) (ref <5.0)
Cholesterol: 196 mg/dL (ref <200)
HDL: 60 mg/dL (ref >50)
LDL CHOLESTEROL (CALC): 124 mg/dL — AB
Non-HDL Cholesterol (Calc): 136 mg/dL (calc) — ABNORMAL HIGH (ref <130)
TRIGLYCERIDES: 40 mg/dL (ref <150)

## 2017-09-28 LAB — CBC WITH DIFFERENTIAL/PLATELET
Basophils Absolute: 60 cells/uL (ref 0–200)
Basophils Relative: 1.2 %
EOS PCT: 5.8 %
Eosinophils Absolute: 290 cells/uL (ref 15–500)
HEMATOCRIT: 39.2 % (ref 35.0–45.0)
HEMOGLOBIN: 13.4 g/dL (ref 11.7–15.5)
LYMPHS ABS: 2110 {cells}/uL (ref 850–3900)
MCH: 31.4 pg (ref 27.0–33.0)
MCHC: 34.2 g/dL (ref 32.0–36.0)
MCV: 91.8 fL (ref 80.0–100.0)
MPV: 11.1 fL (ref 7.5–12.5)
Monocytes Relative: 6.6 %
NEUTROS ABS: 2210 {cells}/uL (ref 1500–7800)
NEUTROS PCT: 44.2 %
Platelets: 284 10*3/uL (ref 140–400)
RBC: 4.27 10*6/uL (ref 3.80–5.10)
RDW: 12 % (ref 11.0–15.0)
Total Lymphocyte: 42.2 %
WBC mixed population: 330 cells/uL (ref 200–950)
WBC: 5 10*3/uL (ref 3.8–10.8)

## 2017-09-28 LAB — COMPLETE METABOLIC PANEL WITH GFR
AG Ratio: 2 (calc) (ref 1.0–2.5)
ALT: 12 U/L (ref 6–29)
AST: 19 U/L (ref 10–35)
Albumin: 4.6 g/dL (ref 3.6–5.1)
Alkaline phosphatase (APISO): 70 U/L (ref 33–130)
BUN: 15 mg/dL (ref 7–25)
CALCIUM: 9.5 mg/dL (ref 8.6–10.4)
CO2: 28 mmol/L (ref 20–32)
CREATININE: 0.64 mg/dL (ref 0.50–0.99)
Chloride: 106 mmol/L (ref 98–110)
GFR, EST NON AFRICAN AMERICAN: 94 mL/min/{1.73_m2} (ref >=60)
GFR, Est African American: 109 mL/min/{1.73_m2} (ref >=60)
GLOBULIN: 2.3 g/dL (ref 1.9–3.7)
GLUCOSE: 90 mg/dL (ref 65–99)
Potassium: 4.7 mmol/L (ref 3.5–5.3)
SODIUM: 140 mmol/L (ref 135–146)
Total Bilirubin: 0.6 mg/dL (ref 0.2–1.2)
Total Protein: 6.9 g/dL (ref 6.1–8.1)

## 2017-09-28 LAB — TSH: TSH: 1 m[IU]/L (ref 0.40–4.50)

## 2017-09-28 LAB — HIV ANTIBODY (ROUTINE TESTING W REFLEX): HIV 1&2 Ab, 4th Generation: NONREACTIVE

## 2017-09-28 LAB — VITAMIN D 25 HYDROXY (VIT D DEFICIENCY, FRACTURES): VIT D 25 HYDROXY: 50 ng/mL (ref 30–100)

## 2017-09-29 LAB — PTH, INTACT AND CALCIUM
CALCIUM: 9.8 mg/dL (ref 8.6–10.4)
PTH: 55 pg/mL (ref 14–64)

## 2017-11-24 ENCOUNTER — Telehealth: Payer: Self-pay | Admitting: Family Medicine

## 2017-11-24 NOTE — Telephone Encounter (Signed)
-----   Message from Hubbard Hartshorn, FNP sent at 09/27/2017 11:20 AM EDT ----- Regarding: Mammogram Reminder Please call patient and let her know that her Mammogram is due now. Thanks!

## 2017-11-24 NOTE — Telephone Encounter (Signed)
There is - thank you!

## 2017-11-24 NOTE — Telephone Encounter (Signed)
Patient has been notified and she will be calling today to schedule appointment. Is there an order in for this.

## 2018-09-13 ENCOUNTER — Other Ambulatory Visit: Payer: Self-pay | Admitting: Family Medicine

## 2018-09-13 ENCOUNTER — Ambulatory Visit: Payer: PRIVATE HEALTH INSURANCE | Admitting: Family Medicine

## 2018-09-13 ENCOUNTER — Encounter: Payer: Self-pay | Admitting: Family Medicine

## 2018-09-13 VITALS — BP 130/82 | HR 94 | Temp 98.4°F | Resp 14 | Ht 64.0 in | Wt 146.6 lb

## 2018-09-13 DIAGNOSIS — N3001 Acute cystitis with hematuria: Secondary | ICD-10-CM

## 2018-09-13 LAB — POCT URINALYSIS DIPSTICK
Bilirubin, UA: NEGATIVE
Glucose, UA: NEGATIVE
KETONES UA: NEGATIVE
Nitrite, UA: NEGATIVE
PH UA: 7.5 (ref 5.0–8.0)
Protein, UA: NEGATIVE
Spec Grav, UA: <=1.005 — AB (ref 1.010–1.025)
UROBILINOGEN UA: 0.2 U/dL

## 2018-09-13 MED ORDER — SULFAMETHOXAZOLE-TRIMETHOPRIM 800-160 MG PO TABS: 1.0000 | ORAL_TABLET | Freq: Two times a day (BID) | ORAL | 0 refills | Status: AC

## 2018-09-13 NOTE — Addendum Note (Signed)
Addended by: Inda Coke on: 09/13/2018 10:39 AM   Modules accepted: Orders

## 2018-09-13 NOTE — Patient Instructions (Signed)

## 2018-09-13 NOTE — Progress Notes (Signed)
Name: Ashlee Mccarthy   MRN: [id number]    DOB: [birth year]   Date:09/13/2018       Progress Note  Subjective  Chief Complaint  Chief Complaint  Patient presents with  . Urinary Tract Infection    burning, frequent urination for 1 week    HPI  PT presents with concern for possible UTI.  She did telehealth visit about 1.5 weeks ago and was told to see a provider in person.  She has been taking AZO and this did help her synmptoms.  This morning she noticed an increase in dysuria, urinary frequency, and urinary urgency. No history of kidney stone; denies fevers/chills, abdominal pain, flank pain, or back pain. Endorses having some nausea but this has improved.  Patient Active Problem List   Diagnosis Date Noted  . Osteoporosis 11/25/2016  . Fever blister 09/16/2016  . History of hysterectomy for benign disease 09/16/2016  . Menopause syndrome 09/16/2016    Social History   Tobacco Use  . Smoking status: Never Smoker  . Smokeless tobacco: Never Used  Substance Use Topics  . Alcohol use: No     Current Outpatient Medications:  .  cholecalciferol (VITAMIN D) 1000 units tablet, Take 1 tablet (1,000 Units total) by mouth daily., Disp: 30 tablet, Rfl: 0 .  BESIVANCE 0.6 % SUSP, INSTILL 1 DROP INTO LEFT EYE 3 TIMES A DAY AS DIRECTED, Disp: , Rfl: 1 .  latanoprost (XALATAN) 0.005 % ophthalmic solution, PLACE 1 DROP INTO BOTH EYES EVERY NIGHT AT BEDTIME, Disp: , Rfl: 4 .  valACYclovir (VALTREX) 500 MG tablet, Take 500 mg by mouth 2 (two) times daily., Disp: , Rfl:   Allergies  Allergen Reactions  . Penicillins Rash    I personally reviewed active problem list, medication list, allergies, health maintenance, lab results with the patient/caregiver today.  ROS Ten systems reviewed and is negative except as mentioned in HPI  Objective  Vitals:   09/13/18 1002  BP: 130/82  Pulse: 94  Resp: 14  Temp: 98.4 F (36.9 C)  TempSrc: Oral  SpO2: 99%  Weight: 146 lb 9.6 oz  (66.5 kg)  Height: 5\' 4"  (1.626 m)   Body mass index is 25.16 kg/m.  Nursing Note and Vital Signs reviewed.  Physical Exam  Constitutional: Patient appears well-developed and well-nourished. No distress.  HEENT: head atraumatic, normocephalic Cardiovascular: Normal rate, regular rhythm and normal heart sounds.  No murmur heard. No BLE edema. Pulmonary/Chest: Effort normal and breath sounds clear bilaterally. No respiratory distress. Abdominal: Soft, bowel sounds normal, there is no tenderness, no HSM, No CVA tenderness Psychiatric: Patient has a normal mood and affect. behavior is normal. Judgment and thought content normal.  Results for orders placed or performed in visit on 09/13/18 (from the past 72 hour(s))  POCT urinalysis dipstick     Status: Abnormal   Collection Time: 09/13/18 10:06 AM  Result Value Ref Range   Color, UA yellow    Clarity, UA cloudy    Glucose, UA Negative Negative   Bilirubin, UA negative    Ketones, UA negative    Spec Grav, UA <=1.005 (A) 1.010 - 1.025   Blood, UA large    pH, UA 7.5 5.0 - 8.0   Protein, UA Negative Negative   Urobilinogen, UA 0.2 0.2 or 1.0 E.U./dL   Nitrite, UA negative    Leukocytes, UA Large (3+) (A) Negative   Appearance cloudy    Odor none    Assessment & Plan  1. Acute  cystitis with hematuria - POCT urinalysis dipstick - sulfamethoxazole-trimethoprim (BACTRIM DS,SEPTRA DS) 800-160 MG tablet; Take 1 tablet by mouth 2 (two) times daily for 3 days.  Dispense: 6 tablet; Refill: 0  -Red flags and when to present for emergency care or RTC including fever >101.2F, chest pain, shortness of breath, new/worsening/un-resolving symptoms, flank pain/abdominal pain/vomiting/frank hematuria reviewed with patient at time of visit. Follow up and care instructions discussed and provided in AVS.

## 2018-09-15 LAB — URINE CULTURE
MICRO NUMBER: 91113939
SPECIMEN QUALITY:: ADEQUATE

## 2018-10-06 ENCOUNTER — Encounter: Payer: Self-pay | Admitting: Family Medicine

## 2018-10-06 ENCOUNTER — Ambulatory Visit (INDEPENDENT_AMBULATORY_CARE_PROVIDER_SITE_OTHER): Payer: No Typology Code available for payment source | Admitting: Family Medicine

## 2018-10-06 ENCOUNTER — Other Ambulatory Visit (HOSPITAL_COMMUNITY)
Admission: RE | Admit: 2018-10-06 | Discharge: 2018-10-06 | Disposition: A | Discharge status: Home / Self Care | Payer: Medicare Other | Source: Ambulatory Visit | Attending: Family Medicine | Admitting: Family Medicine

## 2018-10-06 ENCOUNTER — Ambulatory Visit: Payer: No Typology Code available for payment source

## 2018-10-06 VITALS — BP 130/70 | HR 90 | Temp 97.9°F | Resp 16 | Ht 64.0 in | Wt 145.8 lb

## 2018-10-06 DIAGNOSIS — Z1231 Encounter for screening mammogram for malignant neoplasm of breast: Secondary | ICD-10-CM

## 2018-10-06 DIAGNOSIS — Z131 Encounter for screening for diabetes mellitus: Secondary | ICD-10-CM | POA: Diagnosis not present

## 2018-10-06 DIAGNOSIS — Z113 Encounter for screening for infections with a predominantly sexual mode of transmission: Secondary | ICD-10-CM | POA: Diagnosis not present

## 2018-10-06 DIAGNOSIS — M79661 Pain in right lower leg: Secondary | ICD-10-CM | POA: Diagnosis not present

## 2018-10-06 DIAGNOSIS — Z1212 Encounter for screening for malignant neoplasm of rectum: Secondary | ICD-10-CM | POA: Diagnosis not present

## 2018-10-06 DIAGNOSIS — Z23 Encounter for immunization: Secondary | ICD-10-CM

## 2018-10-06 DIAGNOSIS — M810 Age-related osteoporosis without current pathological fracture: Secondary | ICD-10-CM

## 2018-10-06 DIAGNOSIS — Z13220 Encounter for screening for lipoid disorders: Secondary | ICD-10-CM | POA: Diagnosis not present

## 2018-10-06 DIAGNOSIS — Z0000 Encounter for general adult medical examination without abnormal findings: Secondary | ICD-10-CM | POA: Diagnosis not present

## 2018-10-06 DIAGNOSIS — E663 Overweight: Secondary | ICD-10-CM | POA: Diagnosis not present

## 2018-10-06 DIAGNOSIS — Z1211 Encounter for screening for malignant neoplasm of colon: Secondary | ICD-10-CM | POA: Diagnosis not present

## 2018-10-06 NOTE — Progress Notes (Signed)
Name: Ashlee Mccarthy   MRN: [id number]    DOB: Jan 11, 1951   Date:10/06/2018       Progress Note  Subjective  Chief Complaint  Chief Complaint  Patient presents with  . Annual Exam    HPI  Patient presents for annual CPE.  Diet: Eats well balanced for the most part - does like to eat ice cream sometimes. Exercise: She goes to [employer] and does the circuit training room and bicycle at least 3 times a week, does abs every morning  USPSTF grade A and B recommendations    Office Visit from 10/06/2018 in [facility]  AUDIT-C Score  0     Depression:  Depression screen [facility] 2/9 10/06/2018 09/13/2018 10/13/2016 09/16/2016  Decreased Interest 0 0 0 0  Down, Depressed, Hopeless 0 0 0 0  PHQ - 2 Score 0 0 0 0  Altered sleeping 0 0 - -  Tired, decreased energy 0 0 - -  Change in appetite 0 0 - -  Feeling bad or failure about yourself  0 0 - -  Trouble concentrating 0 0 - -  Moving slowly or fidgety/restless 0 0 - -  Suicidal thoughts 0 0 - -  PHQ-9 Score 0 0 - -  Difficult doing work/chores Not difficult at all Not difficult at all - -   Hypertension: BP Readings from Last 3 Encounters:  10/06/18 130/70  09/13/18 130/82  09/27/17 122/70   Obesity: Wt Readings from Last 3 Encounters:  10/06/18 145 lb 12.8 oz (66.1 kg)  09/13/18 146 lb 9.6 oz (66.5 kg)  09/27/17 143 lb 12.8 oz (65.2 kg)   BMI Readings from Last 3 Encounters:  10/06/18 25.03 kg/m  09/13/18 25.16 kg/m  09/27/17 24.68 kg/m    Hep C Screening: Negative in 2017 STD testing and prevention (HIV/chl/gon/syphilis): We will check today; has had 1 new partner since her husband passed away and has not had STI testing Intimate partner violence: No concerns today Sexual History/Pain during Intercourse: Denies Menstrual History/LMP/Abnormal Bleeding: s/p hysterectomy Incontinence Symptoms: No issues; she is s/p bladder tack  Advanced Care Planning: A voluntary discussion about advance  care planning including the explanation and discussion of advance directives.  Discussed health care proxy and Living will, and the patient was able to identify a health care proxy as Daughter - [place].  Patient does not have a living will at present time. If patient does have living will, I have requested they bring this to the clinic to be scanned in to their chart.  Breast cancer: Due for mammogram No results found for: HMMAMMO  BRCA gene screening: Not indicated Cervical cancer screening: s/p hysterectomy - not indicated  Osteoporosis Screening: Has osteoporosis; due for DEXA - taking vitamin D No results found for: HMDEXASCAN  Lipids: We will recheck today Lab Results  Component Value Date   CHOL 196 09/27/2017   CHOL 192 09/16/2016   Lab Results  Component Value Date   HDL 60 09/27/2017   HDL 73 09/16/2016   Lab Results  Component Value Date   LDLCALC 124 (H) 09/27/2017   LDLCALC 113 09/16/2016   Lab Results  Component Value Date   TRIG 40 09/27/2017   TRIG 28 09/16/2016   Lab Results  Component Value Date   CHOLHDL 3.3 09/27/2017   CHOLHDL 2.6 09/16/2016   No results found for: LDLDIRECT  Glucose: We will check today Glucose  Date Value Ref Range Status  08/24/2012 90 65 -  99 mg/dL Final   Glucose, Bld  Date Value Ref Range Status  09/27/2017 90 65 - 99 mg/dL Final    Comment:    .            Fasting reference interval .   09/16/2016 77 65 - 99 mg/dL Final    Skin cancer: No concerning moles or lesions; used to see a dermatologist - will consider going back for skin survey. Colorectal cancer: Due for this - we will refer today Lung cancer: Not indicated  Patient Active Problem List   Diagnosis Date Noted  . Osteoporosis 11/25/2016  . Fever blister 09/16/2016  . History of hysterectomy for benign disease 09/16/2016  . Menopause syndrome 09/16/2016    Past Surgical History:  Procedure Laterality Date  . ABDOMINAL HYSTERECTOMY    . bladder  tack    . TUBAL LIGATION      Family History  Problem Relation Age of Onset  . Cirrhosis Mother   . Lung cancer Father   . Ovarian cancer Sister   . Colon cancer Maternal Aunt   . Breast cancer Maternal Aunt        3 mat aunts  . Colon cancer Maternal Uncle     Social History   Socioeconomic History  . Marital status: Widowed    Spouse name: Not on file  . Number of children: 2  . Years of education: Not on file  . Highest education level: Not on file  Occupational History  . Not on file  Social Needs  . Financial resource strain: Not hard at all  . Food insecurity:    Worry: Never true    Inability: Never true  . Transportation needs:    Medical: No    Non-medical: No  Tobacco Use  . Smoking status: Never Smoker  . Smokeless tobacco: Never Used  Substance and Sexual Activity  . Alcohol use: No  . Drug use: No  . Sexual activity: Not Currently  Lifestyle  . Physical activity:    Days per week: Not on file    Minutes per session: Not on file  . Stress: Not on file  Relationships  . Social connections:    Talks on phone: Not on file    Gets together: Not on file    Attends religious service: Not on file    Active member of club or organization: Not on file    Attends meetings of clubs or organizations: Not on file    Relationship status: Not on file  . Intimate partner violence:    Fear of current or ex partner: Not on file    Emotionally abused: Not on file    Physically abused: Not on file    Forced sexual activity: Not on file  Other Topics Concern  . 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     Current Outpatient Medications:  .  BESIVANCE 0.6 % SUSP, INSTILL 1 DROP INTO LEFT EYE 3 TIMES A DAY AS DIRECTED, Disp: , Rfl: 1 .  cholecalciferol (VITAMIN  D) 1000 units tablet, Take 1 tablet (1,000 Units total) by mouth daily., Disp: 30 tablet, Rfl: 0 .  latanoprost (XALATAN) 0.005 % ophthalmic solution, PLACE 1 DROP INTO BOTH EYES EVERY  NIGHT AT BEDTIME, Disp: , Rfl: 4 .  valACYclovir (VALTREX) 500 MG tablet, Take 500 mg by mouth 2 (two) times daily., Disp: , Rfl:   Allergies  Allergen Reactions  . Penicillins Rash    ROS  Constitutional: Negative for fever or weight change.  Respiratory: Negative for cough and shortness of breath.   Cardiovascular: Negative for chest pain or palpitations.  Gastrointestinal: Negative for abdominal pain, no bowel changes.  Musculoskeletal: Negative for gait problem or joint swelling. She does note some RIGHT lateral calf pain intermittent for a few days - no recent injury, no swelling, redness, heat to the area. Skin: Negative for rash.  Neurological: Negative for dizziness or headache.  No other specific complaints in a complete review of systems (except as listed in HPI above).  Objective  Vitals:   10/06/18 1024  BP: 130/70  Pulse: 90  Resp: 16  Temp: 97.9 F (36.6 C)  TempSrc: Oral  SpO2: 99%  Weight: 145 lb 12.8 oz (66.1 kg)  Height: 5' 4"  (1.626 m)    Body mass index is 25.03 kg/m.  Physical Exam  Constitutional: Patient appears well-developed and well-nourished. No distress.  HENT: Head: Normocephalic and atraumatic. Ears: B TMs ok, no erythema or effusion; Nose: Nose normal. Mouth/Throat: Oropharynx is clear and moist. No oropharyngeal exudate.  Eyes: Conjunctivae and EOM are normal. Pupils are equal, round, and reactive to light. No scleral icterus.  Neck: Normal range of motion. Neck supple. No JVD present. No thyromegaly present.  Cardiovascular: Normal rate, regular rhythm and normal heart sounds.  No murmur heard. No BLE edema. Pulmonary/Chest: Effort normal and breath sounds normal. No respiratory distress. Abdominal: Soft. Bowel sounds are normal, no distension. There is no  tenderness. no masses Breast: no lumps or masses, no nipple discharge or rashes Musculoskeletal: Normal range of motion, no joint effusions. No gross deformities. RIGHT lateral calf exhibits very mild tenderness - no erythema, swelling, or heat to the area. Neurological: he is alert and oriented to person, place, and time. No cranial nerve deficit. Coordination, balance, strength, speech and gait are normal.  Skin: Skin is warm and dry. No rash noted. No erythema.  Psychiatric: Patient has a normal mood and affect. behavior is normal. Judgment and thought content normal.  Recent Results (from the past 2160 hour(s))  POCT urinalysis dipstick     Status: Abnormal   Collection Time: 09/13/18 10:06 AM  Result Value Ref Range   Color, UA yellow    Clarity, UA cloudy    Glucose, UA Negative Negative   Bilirubin, UA negative    Ketones, UA negative    Spec Grav, UA <=1.005 (A) 1.010 - 1.025   Blood, UA large    pH, UA 7.5 5.0 - 8.0   Protein, UA Negative Negative   Urobilinogen, UA 0.2 0.2 or 1.0 E.U./dL   Nitrite, UA negative    Leukocytes, UA Large (3+) (A) Negative   Appearance cloudy    Odor none   Urine Culture     Status: Abnormal   Collection Time: 09/13/18 11:04 AM  Result Value Ref Range   MICRO NUMBER: [id number]    SPECIMEN QUALITY: ADEQUATE    Sample Source OTHER (SPECIFY)    STATUS: FINAL    ISOLATE 1: Escherichia coli (A)     Comment: 10,000-50,000 CFU/mL of Escherichia coli      Susceptibility   Escherichia coli - URINE CULTURE, REFLEX    AMOX/CLAVULANIC 16 Intermediate     AMPICILLIN >=32  Resistant     AMPICILLIN/SULBACTAM 16 Intermediate     CEFAZOLIN* <=4 Not Reportable      * For infections other than uncomplicated UTIcaused by E. coli, K. pneumoniae or P. mirabilis:Cefazolin is resistant if MIC > or = 8 mcg/mL.(Distinguishing susceptible versus intermediatefor isolates with MIC < or = 4 mcg/mL requiresadditional testing.)For uncomplicated UTI caused by E. coli,K.  pneumoniae or P. mirabilis: Cefazolin issusceptible if MIC <32 mcg/mL and predictssusceptible to the oral agents cefaclor, cefdinir,cefpodoxime, cefprozil, cefuroxime, cephalexinand loracarbef.    CEFEPIME <=1 Sensitive     CEFTRIAXONE <=1 Sensitive     CIPROFLOXACIN <=0.25 Sensitive     LEVOFLOXACIN <=0.12 Sensitive     ERTAPENEM <=0.5 Sensitive     GENTAMICIN <=1 Sensitive     IMIPENEM <=0.25 Sensitive     NITROFURANTOIN <=16 Sensitive     PIP/TAZO <=4 Sensitive     TOBRAMYCIN <=1 Sensitive     TRIMETH/SULFA* <=20 Sensitive      * For infections other than uncomplicated UTIcaused by E. coli, K. pneumoniae or P. mirabilis:Cefazolin is resistant if MIC > or = 8 mcg/mL.(Distinguishing susceptible versus intermediatefor isolates with MIC < or = 4 mcg/mL requiresadditional testing.)For uncomplicated UTI caused by E. coli,K. pneumoniae or P. mirabilis: Cefazolin issusceptible if MIC <32 mcg/mL and predictssusceptible to the oral agents cefaclor, cefdinir,cefpodoxime, cefprozil, cefuroxime, cephalexinand loracarbef.Legend:S = Susceptible  I = IntermediateR = Resistant  NS = Not susceptible* = Not tested  NR = Not reported**NN = See antimicrobic comments   PHQ2/9: Depression screen [facility] 2/9 10/06/2018 09/13/2018 10/13/2016 09/16/2016  Decreased Interest 0 0 0 0  Down, Depressed, Hopeless 0 0 0 0  PHQ - 2 Score 0 0 0 0  Altered sleeping 0 0 - -  Tired, decreased energy 0 0 - -  Change in appetite 0 0 - -  Feeling bad or failure about yourself  0 0 - -  Trouble concentrating 0 0 - -  Moving slowly or fidgety/restless 0 0 - -  Suicidal thoughts 0 0 - -  PHQ-9 Score 0 0 - -  Difficult doing work/chores Not difficult at all Not difficult at all - -   Fall Risk: Fall Risk  10/06/2018 09/13/2018 10/13/2016 09/16/2016  Falls in the past year? No No No No   Assessment & Plan  1. Well woman exam (no gynecological exam) -USPSTF grade A and B recommendations reviewed with patient; age-appropriate  recommendations, preventive care, screening tests, etc discussed and encouraged; healthy living encouraged; see AVS for patient education given to patient -Discussed importance of 150 minutes of physical activity weekly, eat two servings of fish weekly, eat one serving of tree nuts ( cashews, pistachios, pecans, almonds.Marland Kitchen) every other day, eat 6 servings of fruit/vegetables daily and drink plenty of water and avoid sweet beverages.  - Lipid panel - COMPLETE METABOLIC PANEL WITH GFR - Cervicovaginal ancillary only - RPR - DG Bone Density; Future - MM 3D SCREEN BREAST BILATERAL; Future - Flu Vaccine QUAD 6+ mos PF IM (Fluarix Quad PF) - Ambulatory referral to Gastroenterology  2. Screening examination for STD (sexually transmitted disease) - Cervicovaginal ancillary only - RPR  3. Breast cancer screening by mammogram - MM 3D SCREEN BREAST BILATERAL; Future  4. Osteoporosis without current pathological fracture, unspecified osteoporosis type - DG Bone Density; Future  5. Needs flu shot - Flu Vaccine QUAD 6+ mos PF IM (Fluarix Quad PF)  6. Screening for colorectal cancer - Ambulatory referral to Gastroenterology  7. Right calf  pain - Advised to monitor; discussed signs and symptoms of blood clot at length - I do not suspect this at this time.  She will monitor and present for emergency care PRN

## 2018-10-06 NOTE — Patient Instructions (Addendum)
Please schedule your mammogram and bone density scan with [facility] on or after November 23 2018. Preventive Care 75 Years and Older, Female Preventive care refers to lifestyle choices and visits with your health care provider that can promote health and wellness. What does preventive care include?  A yearly physical exam. This is also called an annual well check.  Dental exams once or twice a year.  Routine eye exams. Ask your health care provider how often you should have your eyes checked.  Personal lifestyle choices, including: ? Daily care of your teeth and gums. ? Regular physical activity. ? Eating a healthy diet. ? Avoiding tobacco and drug use. ? Limiting alcohol use. ? Practicing safe sex. ? Taking low-dose aspirin every day. ? Taking vitamin and mineral supplements as recommended by your health care provider. What happens during an annual well check? The services and screenings done by your health care provider during your annual well check will depend on your age, overall health, lifestyle risk factors, and family history of disease. Counseling Your health care provider may ask you questions about your:  Alcohol use.  Tobacco use.  Drug use.  Emotional well-being.  Home and relationship well-being.  Sexual activity.  Eating habits.  History of falls.  Memory and ability to understand (cognition).  Work and work Statistician.  Reproductive health.  Screening You may have the following tests or measurements:  Height, weight, and BMI.  Blood pressure.  Lipid and cholesterol levels. These may be checked every 5 years, or more frequently if you are over 55 years old.  Skin check.  Lung cancer screening. You may have this screening every year starting at age 52 if you have a 30-pack-year history of smoking and currently smoke or have quit within the past 15 years.  Fecal occult blood test (FOBT) of the stool. You may have this test every  year starting at age 58.  Flexible sigmoidoscopy or colonoscopy. You may have a sigmoidoscopy every 5 years or a colonoscopy every 10 years starting at age 43.  Hepatitis C blood test.  Hepatitis B blood test.  Sexually transmitted disease (STD) testing.  Diabetes screening. This is done by checking your blood sugar (glucose) after you have not eaten for a while (fasting). You may have this done every 1-3 years.  Bone density scan. This is done to screen for osteoporosis. You may have this done starting at age 11.  Mammogram. This may be done every 1-2 years. Talk to your health care provider about how often you should have regular mammograms.  Talk with your health care provider about your test results, treatment options, and if necessary, the need for more tests. Vaccines Your health care provider may recommend certain vaccines, such as:  Influenza vaccine. This is recommended every year.  Tetanus, diphtheria, and acellular pertussis (Tdap, Td) vaccine. You may need a Td booster every 10 years.  Varicella vaccine. You may need this if you have not been vaccinated.  Zoster vaccine. You may need this after age 59.  Measles, mumps, and rubella (MMR) vaccine. You may need at least one dose of MMR if you were born in 1957 or later. You may also need a second dose.  Pneumococcal 13-valent conjugate (PCV13) vaccine. One dose is recommended after age 2.  Pneumococcal polysaccharide (PPSV23) vaccine. One dose is recommended after age 34.  Meningococcal vaccine. You may need this if you have certain conditions.  Hepatitis A vaccine. You may need this if you  have certain conditions or if you travel or work in places where you may be exposed to hepatitis A.  Hepatitis B vaccine. You may need this if you have certain conditions or if you travel or work in places where you may be exposed to hepatitis B.  Haemophilus influenzae type b (Hib) vaccine. You may need this if you have certain  conditions.  Talk to your health care provider about which screenings and vaccines you need and how often you need them. This information is not intended to replace advice given to you by your health care provider. Make sure you discuss any questions you have with your health care provider. Document Released: 01/10/2016 Document Revised: 09/02/2016 Document Reviewed: 10/15/2015 Elsevier Interactive Patient Education  [employer].

## 2018-10-07 LAB — COMPLETE METABOLIC PANEL WITH GFR
AG Ratio: 2 (calc) (ref 1.0–2.5)
ALKALINE PHOSPHATASE (APISO): 68 U/L (ref 33–130)
ALT: 13 U/L (ref 6–29)
AST: 21 U/L (ref 10–35)
Albumin: 4.7 g/dL (ref 3.6–5.1)
BUN: 9 mg/dL (ref 7–25)
CALCIUM: 10 mg/dL (ref 8.6–10.4)
CHLORIDE: 104 mmol/L (ref 98–110)
CO2: 31 mmol/L (ref 20–32)
CREATININE: 0.68 mg/dL (ref 0.50–0.99)
GFR, EST NON AFRICAN AMERICAN: 92 mL/min/{1.73_m2} (ref >=60)
GFR, Est African American: 106 mL/min/{1.73_m2} (ref >=60)
GLUCOSE: 82 mg/dL (ref 65–99)
Globulin: 2.3 g/dL (calc) (ref 1.9–3.7)
POTASSIUM: 4.1 mmol/L (ref 3.5–5.3)
Sodium: 140 mmol/L (ref 135–146)
Total Bilirubin: 0.6 mg/dL (ref 0.2–1.2)
Total Protein: 7 g/dL (ref 6.1–8.1)

## 2018-10-07 LAB — LIPID PANEL
CHOL/HDL RATIO: 2.9 (calc) (ref <5.0)
Cholesterol: 185 mg/dL (ref <200)
HDL: 63 mg/dL (ref >50)
LDL Cholesterol (Calc): 109 mg/dL (calc) — ABNORMAL HIGH
NON-HDL CHOLESTEROL (CALC): 122 mg/dL (ref <130)
Triglycerides: 43 mg/dL (ref <150)

## 2018-10-07 LAB — CERVICOVAGINAL ANCILLARY ONLY
Chlamydia: NEGATIVE
NEISSERIA GONORRHEA: NEGATIVE

## 2018-10-07 LAB — RPR: RPR Ser Ql: NONREACTIVE

## 2018-11-02 ENCOUNTER — Other Ambulatory Visit: Payer: Self-pay

## 2018-11-02 DIAGNOSIS — Z1211 Encounter for screening for malignant neoplasm of colon: Secondary | ICD-10-CM

## 2018-11-03 DIAGNOSIS — X32XXXA Exposure to sunlight, initial encounter: Secondary | ICD-10-CM | POA: Diagnosis not present

## 2018-11-03 DIAGNOSIS — L821 Other seborrheic keratosis: Secondary | ICD-10-CM | POA: Diagnosis not present

## 2018-11-03 DIAGNOSIS — D2261 Melanocytic nevi of right upper limb, including shoulder: Secondary | ICD-10-CM | POA: Diagnosis not present

## 2018-11-03 DIAGNOSIS — D2271 Melanocytic nevi of right lower limb, including hip: Secondary | ICD-10-CM | POA: Diagnosis not present

## 2018-11-03 DIAGNOSIS — D225 Melanocytic nevi of trunk: Secondary | ICD-10-CM | POA: Diagnosis not present

## 2018-11-03 DIAGNOSIS — L570 Actinic keratosis: Secondary | ICD-10-CM | POA: Diagnosis not present

## 2018-11-03 DIAGNOSIS — D2262 Melanocytic nevi of left upper limb, including shoulder: Secondary | ICD-10-CM | POA: Diagnosis not present

## 2018-11-03 DIAGNOSIS — D2272 Melanocytic nevi of left lower limb, including hip: Secondary | ICD-10-CM | POA: Diagnosis not present

## 2018-11-14 ENCOUNTER — Encounter: Payer: Self-pay | Admitting: *Deleted

## 2018-11-14 ENCOUNTER — Other Ambulatory Visit: Payer: Self-pay

## 2018-11-14 ENCOUNTER — Telehealth: Payer: Self-pay | Admitting: Gastroenterology

## 2018-11-14 NOTE — Telephone Encounter (Signed)
error 

## 2018-11-17 NOTE — Discharge Instructions (Signed)
General Anesthesia, Adult, Care After °These instructions provide you with information about caring for yourself after your procedure. Your health care provider may also give you more specific instructions. Your treatment has been planned according to current medical practices, but problems sometimes occur. Call your health care provider if you have any problems or questions after your procedure. °What can I expect after the procedure? °After the procedure, it is common to have: °· Vomiting. °· A sore throat. °· Mental slowness. ° °It is common to feel: °· Nauseous. °· Cold or shivery. °· Sleepy. °· Tired. °· Sore or achy, even in parts of your body where you did not have surgery. ° °Follow these instructions at home: °For at least 24 hours after the procedure: °· Do not: °? Participate in activities where you could fall or become injured. °? Drive. °? Use heavy machinery. °? Drink alcohol. °? Take sleeping pills or medicines that cause drowsiness. °? Make important decisions or sign legal documents. °? Take care of children on your own. °· Rest. °Eating and drinking °· If you vomit, drink water, juice, or soup when you can drink without vomiting. °· Drink enough fluid to keep your urine clear or pale yellow. °· Make sure you have little or no nausea before eating solid foods. °· Follow the diet recommended by your health care provider. °General instructions °· Have a responsible adult stay with you until you are awake and alert. °· Return to your normal activities as told by your health care provider. Ask your health care provider what activities are safe for you. °· Take over-the-counter and prescription medicines only as told by your health care provider. °· If you smoke, do not smoke without supervision. °· Keep all follow-up visits as told by your health care provider. This is important. °Contact a health care provider if: °· You continue to have nausea or vomiting at home, and medicines are not helpful. °· You  cannot drink fluids or start eating again. °· You cannot urinate after 8-12 hours. °· You develop a skin rash. °· You have fever. °· You have increasing redness at the site of your procedure. °Get help right away if: °· You have difficulty breathing. °· You have chest pain. °· You have unexpected bleeding. °· You feel that you are having a life-threatening or urgent problem. °This information is not intended to replace advice given to you by your health care provider. Make sure you discuss any questions you have with your health care provider. °Document Released: 03/22/2001 Document Revised: 05/18/2016 Document Reviewed: 11/28/2015 °Elsevier Interactive Patient Education © 2018 Elsevier Inc. ° °

## 2018-11-21 ENCOUNTER — Encounter
Admission: RE | Disposition: A | Discharge status: Home / Self Care | Payer: Self-pay | Source: Ambulatory Visit | Attending: Gastroenterology

## 2018-11-21 ENCOUNTER — Ambulatory Visit
Admission: RE | Admit: 2018-11-21 | Discharge: 2018-11-21 | Disposition: A | Discharge status: Home / Self Care | Payer: 59 | Source: Ambulatory Visit | Attending: Gastroenterology | Admitting: Gastroenterology

## 2018-11-21 ENCOUNTER — Ambulatory Visit: Payer: 59 | Admitting: Anesthesiology

## 2018-11-21 DIAGNOSIS — D122 Benign neoplasm of ascending colon: Secondary | ICD-10-CM | POA: Diagnosis not present

## 2018-11-21 DIAGNOSIS — Z79899 Other long term (current) drug therapy: Secondary | ICD-10-CM | POA: Insufficient documentation

## 2018-11-21 DIAGNOSIS — Z1211 Encounter for screening for malignant neoplasm of colon: Secondary | ICD-10-CM | POA: Diagnosis not present

## 2018-11-21 DIAGNOSIS — K640 First degree hemorrhoids: Secondary | ICD-10-CM | POA: Diagnosis not present

## 2018-11-21 DIAGNOSIS — K635 Polyp of colon: Secondary | ICD-10-CM | POA: Diagnosis not present

## 2018-11-21 HISTORY — PX: COLONOSCOPY WITH PROPOFOL: SHX5780

## 2018-11-21 HISTORY — PX: POLYPECTOMY: SHX5525

## 2018-11-21 HISTORY — DX: Other specified postprocedural states: Z98.890

## 2018-11-21 HISTORY — DX: Nausea with vomiting, unspecified: R11.2

## 2018-11-21 SURGERY — COLONOSCOPY WITH PROPOFOL
Anesthesia: General | Site: Rectum

## 2018-11-21 MED ORDER — LIDOCAINE HCL (CARDIAC) PF 100 MG/5ML IV SOSY
PREFILLED_SYRINGE | INTRAVENOUS | Status: DC | PRN
  Administered 2018-11-21 (×2): 40 mg via INTRAVENOUS

## 2018-11-21 MED ORDER — STERILE WATER FOR IRRIGATION IR SOLN
Status: DC | PRN
  Administered 2018-11-21: 08:00:00

## 2018-11-21 MED ORDER — SODIUM CHLORIDE 0.9 % IV SOLN: INTRAVENOUS | Status: DC

## 2018-11-21 MED ORDER — PROPOFOL 10 MG/ML IV BOLUS
INTRAVENOUS | Status: DC | PRN
  Administered 2018-11-21: 20 mg via INTRAVENOUS
  Administered 2018-11-21: 100 mg via INTRAVENOUS
  Administered 2018-11-21 (×2): 30 mg via INTRAVENOUS
  Administered 2018-11-21: 20 mg via INTRAVENOUS
  Administered 2018-11-21 (×2): 30 mg via INTRAVENOUS
  Administered 2018-11-21: 20 mg via INTRAVENOUS

## 2018-11-21 MED ORDER — ONDANSETRON HCL 4 MG/2ML IJ SOLN: 4.0000 mg | Freq: Once | INTRAMUSCULAR | Status: DC | PRN

## 2018-11-21 MED ORDER — LACTATED RINGERS IV SOLN
10.0000 mL/h | INTRAVENOUS | Status: DC
  Administered 2018-11-21: 10 mL/h via INTRAVENOUS

## 2018-11-21 SURGICAL SUPPLY — 7 items
CANISTER SUCT 1200ML W/VALVE (MISCELLANEOUS) ×3 IMPLANT
GOWN CVR UNV OPN BCK APRN NK (MISCELLANEOUS) ×2 IMPLANT
GOWN ISOL THUMB LOOP REG UNIV (MISCELLANEOUS) ×4
KIT ENDO PROCEDURE OLY (KITS) ×3 IMPLANT
SNARE SHORT THROW 13M SML OVAL (MISCELLANEOUS) ×3 IMPLANT
TRAP ETRAP POLY (MISCELLANEOUS) ×3 IMPLANT
WATER STERILE IRR 250ML POUR (IV SOLUTION) ×3 IMPLANT

## 2018-11-21 NOTE — H&P (Signed)
Ashlee Lame, MD [address] [place], [place] [address] Phone: [phone] Fax : [phone]  Primary Care Physician:  Ashlee Sizer, MD Primary Gastroenterologist:  Dr. Allen Mccarthy  Pre-Procedure History & Physical: HPI:  Ashlee Mccarthy is a 67 y.o. female is here for a screening colonoscopy.   Past Medical History:  Diagnosis Date  . Fever blister   . PONV (postoperative nausea and vomiting)     Past Surgical History:  Procedure Laterality Date  . ABDOMINAL HYSTERECTOMY    . bladder tack    . CATARACT EXTRACTION W/ INTRAOCULAR LENS  IMPLANT, BILATERAL    . TUBAL LIGATION      Prior to Admission medications   Medication Sig Start Date End Date Taking? Authorizing Provider  BESIVANCE 0.6 % SUSP INSTILL 1 DROP INTO LEFT EYE 3 TIMES A DAY AS DIRECTED 06/27/18  Yes [provider]  cholecalciferol (VITAMIN D) 1000 units tablet Take 1 tablet (1,000 Units total) by mouth daily. 09/27/17  Yes Hubbard Hartshorn, FNP  latanoprost (XALATAN) 0.005 % ophthalmic solution PLACE 1 DROP INTO BOTH EYES EVERY NIGHT AT BEDTIME 09/04/18  Yes [provider]  valACYclovir (VALTREX) 500 MG tablet Take 500 mg by mouth 2 (two) times daily.   Yes [provider]    Allergies as of 11/02/2018 - Review Complete 10/06/2018  Allergen Reaction Noted  . Penicillins Rash 09/16/2016    Family History  Problem Relation Age of Onset  . Cirrhosis Mother   . Lung cancer Father   . Ovarian cancer Sister   . Colon cancer Maternal Aunt   . Breast cancer Maternal Aunt        3 mat aunts  . Colon cancer Maternal Uncle     Social History   Socioeconomic History  . Marital status: Widowed    Spouse name: Not on file  . Number of children: 2  . Years of education: Not on file  . Highest education level: Not on file  Occupational History  . Not on file  Social Needs  . Financial resource strain: Not hard at all  . Food insecurity:    Worry: Never true   Inability: Never true  . Transportation needs:    Medical: No    Non-medical: No  Tobacco Use  . Smoking status: Never Smoker  . Smokeless tobacco: Never Used  Substance and Sexual Activity  . Alcohol use: No  . Drug use: No  . Sexual activity: Not Currently  Lifestyle  . Physical activity:    Days per week: 3 days    Minutes per session: 50 min  . Stress: Only a little  Relationships  . Social connections:    Talks on phone: More than three times a week    Gets together: More than three times a week    Attends religious service: Never    Active member of club or organization: No    Attends meetings of clubs or organizations: Never    Relationship status: Widowed  . Intimate partner violence:    Fear of current or ex partner: No    Emotionally abused: No    Physically abused: No    Forced sexual activity: No  Other Topics Concern  . 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    Review of Systems: See HPI, otherwise negative ROS  Physical Exam: BP (!) 143/76   Pulse 77   Temp (!) 97.5 F (36.4 C) (Temporal)   Resp 16   Ht 5\' 4"  (1.626 m)   Wt 64 kg   SpO2 100%   BMI 24.20 kg/m  General:   Alert,  pleasant and cooperative in NAD Head:  Normocephalic and atraumatic. Neck:  Supple; no masses or thyromegaly. Lungs:  Clear throughout to auscultation.    Heart:  Regular rate and rhythm. Abdomen:  Soft, nontender and nondistended. Normal bowel sounds, without guarding, and without rebound.   Neurologic:  Alert and  oriented x4;  grossly normal neurologically.  Impression/Plan: Ashlee Mccarthy is now here to undergo a screening colonoscopy.  Risks, benefits, and alternatives regarding colonoscopy have been reviewed with the patient.  Questions have been answered.   All parties agreeable.

## 2018-11-21 NOTE — Anesthesia Postprocedure Evaluation (Signed)
Anesthesia Post Note  Patient: Ashlee Mccarthy  Procedure(s) Performed: COLONOSCOPY WITH BIOPSIES (N/A Rectum) POLYPECTOMY (N/A Rectum)  Patient location during evaluation: PACU Anesthesia Type: General Level of consciousness: awake and alert and oriented Pain management: satisfactory to patient Vital Signs Assessment: post-procedure vital signs reviewed and stable Respiratory status: spontaneous breathing, nonlabored ventilation and respiratory function stable Cardiovascular status: blood pressure returned to baseline and stable Postop Assessment: Adequate PO intake and No signs of nausea or vomiting Anesthetic complications: no    Raliegh Ip

## 2018-11-21 NOTE — Anesthesia Procedure Notes (Signed)
Date/Time: 11/21/2018 8:14 AM Performed by: Janna Arch, CRNA Pre-anesthesia Checklist: Patient identified, Emergency Drugs available, Suction available, Timeout performed and Patient being monitored Patient Re-evaluated:Patient Re-evaluated prior to induction Oxygen Delivery Method: Nasal cannula Placement Confirmation: positive ETCO2

## 2018-11-21 NOTE — Transfer of Care (Signed)
Immediate Anesthesia Transfer of Care Note  Patient: Ashlee Mccarthy  Procedure(s) Performed: COLONOSCOPY WITH BIOPSIES (N/A Rectum) POLYPECTOMY (N/A Rectum)  Patient Location: PACU  Anesthesia Type: General  Level of Consciousness: awake, alert  and patient cooperative  Airway and Oxygen Therapy: Patient Spontanous Breathing and Patient connected to supplemental oxygen  Post-op Assessment: Post-op Vital signs reviewed, Patient's Cardiovascular Status Stable, Respiratory Function Stable, Patent Airway and No signs of Nausea or vomiting  Post-op Vital Signs: Reviewed and stable  Complications: No apparent anesthesia complications

## 2018-11-21 NOTE — Anesthesia Preprocedure Evaluation (Signed)
Anesthesia Evaluation  Patient identified by MRN, date of birth, ID band Patient awake    Reviewed: Allergy & Precautions, H&P , NPO status , Patient's Chart, lab work & pertinent test results  History of Anesthesia Complications (+) PONV  Airway Mallampati: II  TM Distance: >3 FB Neck ROM: full    Dental no notable dental hx.    Pulmonary    Pulmonary exam normal breath sounds clear to auscultation       Cardiovascular Normal cardiovascular exam Rhythm:regular Rate:Normal     Neuro/Psych    GI/Hepatic   Endo/Other    Renal/GU      Musculoskeletal   Abdominal   Peds  Hematology   Anesthesia Other Findings   Reproductive/Obstetrics                             Anesthesia Physical Anesthesia Plan  ASA: II  Anesthesia Plan: General   Post-op Pain Management:    Induction: Intravenous  PONV Risk Score and Plan: 4 or greater and Propofol infusion and Treatment may vary due to age or medical condition  Airway Management Planned: Natural Airway  Additional Equipment:   Intra-op Plan:   Post-operative Plan:   Informed Consent: I have reviewed the patients History and Physical, chart, labs and discussed the procedure including the risks, benefits and alternatives for the proposed anesthesia with the patient or authorized representative who has indicated his/her understanding and acceptance.     Plan Discussed with: CRNA  Anesthesia Plan Comments:         Anesthesia Quick Evaluation

## 2018-11-21 NOTE — Op Note (Signed)
[facility] Patient Name: Ashlee Mccarthy Procedure Date: 11/21/2018 8:09 AM MRN: [id number] Account #: [id number] Date of Birth: January 12, 1951 Admit Type: Outpatient Age: 67 Room: [facility] OR ROOM 01 Gender: Female Note Status: Finalized Procedure:            Colonoscopy Indications:          Screening for colorectal malignant neoplasm Providers:            Lucilla Lame MD, MD Referring MD:         Bethena Roys. Sowles, MD (Referring MD) Medicines:            Propofol per Anesthesia Complications:        No immediate complications. Procedure:            Pre-Anesthesia Assessment:                       - Prior to the procedure, a History and Physical was                        performed, and patient medications and allergies were                        reviewed. The patient's tolerance of previous                        anesthesia was also reviewed. The risks and benefits of                        the procedure and the sedation options and risks were                        discussed with the patient. All questions were                        answered, and informed consent was obtained. Prior                        Anticoagulants: The patient has taken no previous                        anticoagulant or antiplatelet agents. ASA Grade                        Assessment: II - A patient with mild systemic disease.                        After reviewing the risks and benefits, the patient was                        deemed in satisfactory condition to undergo the                        procedure.                       After obtaining informed consent, the colonoscope was                        passed under direct vision. Throughout the procedure,  the patient's blood pressure, pulse, and oxygen                        saturations were monitored continuously. The was                        introduced through the anus and advanced to the the                      cecum, identified by appendiceal orifice and ileocecal                        valve. The colonoscopy was performed without                        difficulty. The patient tolerated the procedure well.                        The quality of the bowel preparation was fair. Findings:      The perianal and digital rectal examinations were normal.      A 5 mm polyp was found in the ascending colon. The polyp was sessile.       The polyp was removed with a cold snare. Resection and retrieval were       complete.      Non-bleeding internal hemorrhoids were found during retroflexion. The       hemorrhoids were Grade I (internal hemorrhoids that do not prolapse). Impression:           - Preparation of the colon was fair.                       - One 5 mm polyp in the ascending colon, removed with a                        cold snare. Resected and retrieved.                       - Non-bleeding internal hemorrhoids. Recommendation:       - Discharge patient to home.                       - Resume previous diet.                       - Continue present medications.                       - Await pathology results.                       - Repeat colonoscopy in 5 years for surveillance. Procedure Code(s):    --- Professional ---                       [phone], Colonoscopy, flexible; with removal of tumor(s),                        polyp(s), or other lesion(s) by snare technique Diagnosis Code(s):    --- Professional ---                       Z12.11, Encounter for screening  for malignant neoplasm                        of colon                       D12.2, Benign neoplasm of ascending colon CPT copyright 2018 American Medical Association. All rights reserved. The codes documented in this report are preliminary and upon coder review may  be revised to meet current compliance requirements. Lucilla Lame MD, MD 11/21/2018 8:39:57 AM This report has been signed electronically. Number of  Addenda: 0 Note Initiated On: 11/21/2018 8:09 AM Scope Withdrawal Time: 0 hours 14 minutes 42 seconds  Total Procedure Duration: 0 hours 20 minutes 24 seconds       [facility]

## 2018-11-22 ENCOUNTER — Encounter: Payer: Self-pay | Admitting: Gastroenterology

## 2018-11-23 ENCOUNTER — Encounter: Payer: Self-pay | Admitting: Gastroenterology

## 2020-01-11 DIAGNOSIS — D225 Melanocytic nevi of trunk: Secondary | ICD-10-CM | POA: Diagnosis not present

## 2020-01-11 DIAGNOSIS — D2261 Melanocytic nevi of right upper limb, including shoulder: Secondary | ICD-10-CM | POA: Diagnosis not present

## 2020-01-11 DIAGNOSIS — L821 Other seborrheic keratosis: Secondary | ICD-10-CM | POA: Diagnosis not present

## 2020-01-11 DIAGNOSIS — D2271 Melanocytic nevi of right lower limb, including hip: Secondary | ICD-10-CM | POA: Diagnosis not present

## 2020-01-11 DIAGNOSIS — Z1283 Encounter for screening for malignant neoplasm of skin: Secondary | ICD-10-CM | POA: Diagnosis not present

## 2020-01-11 DIAGNOSIS — D2262 Melanocytic nevi of left upper limb, including shoulder: Secondary | ICD-10-CM | POA: Diagnosis not present

## 2020-01-11 DIAGNOSIS — D2272 Melanocytic nevi of left lower limb, including hip: Secondary | ICD-10-CM | POA: Diagnosis not present

## 2020-02-05 DIAGNOSIS — H40153 Residual stage of open-angle glaucoma, bilateral: Secondary | ICD-10-CM | POA: Diagnosis not present

## 2020-02-19 DIAGNOSIS — R69 Illness, unspecified: Secondary | ICD-10-CM | POA: Diagnosis not present

## 2020-04-11 ENCOUNTER — Other Ambulatory Visit: Payer: Self-pay

## 2020-04-11 ENCOUNTER — Ambulatory Visit (INDEPENDENT_AMBULATORY_CARE_PROVIDER_SITE_OTHER): Payer: Medicare HMO | Admitting: Family Medicine

## 2020-04-11 ENCOUNTER — Encounter: Payer: Self-pay | Admitting: Family Medicine

## 2020-04-11 VITALS — BP 150/80 | HR 91 | Temp 96.8°F | Resp 16 | Ht 62.75 in | Wt 150.9 lb

## 2020-04-11 DIAGNOSIS — H40153 Residual stage of open-angle glaucoma, bilateral: Secondary | ICD-10-CM | POA: Diagnosis not present

## 2020-04-11 DIAGNOSIS — Z23 Encounter for immunization: Secondary | ICD-10-CM

## 2020-04-11 DIAGNOSIS — M810 Age-related osteoporosis without current pathological fracture: Secondary | ICD-10-CM | POA: Diagnosis not present

## 2020-04-11 DIAGNOSIS — R030 Elevated blood-pressure reading, without diagnosis of hypertension: Secondary | ICD-10-CM

## 2020-04-11 DIAGNOSIS — Z803 Family history of malignant neoplasm of breast: Secondary | ICD-10-CM

## 2020-04-11 DIAGNOSIS — Z0000 Encounter for general adult medical examination without abnormal findings: Secondary | ICD-10-CM | POA: Diagnosis not present

## 2020-04-11 DIAGNOSIS — E7800 Pure hypercholesterolemia, unspecified: Secondary | ICD-10-CM | POA: Diagnosis not present

## 2020-04-11 DIAGNOSIS — Z1231 Encounter for screening mammogram for malignant neoplasm of breast: Secondary | ICD-10-CM

## 2020-04-11 NOTE — Patient Instructions (Signed)
Preventive Care 67 Years and Older, Female Preventive care refers to lifestyle choices and visits with your health care provider that can promote health and wellness. This includes:  A yearly physical exam. This is also called an annual well check.  Regular dental and eye exams.  Immunizations.  Screening for certain conditions.  Healthy lifestyle choices, such as diet and exercise. What can I expect for my preventive care visit? Physical exam Your health care provider will check:  Height and weight. These may be used to calculate body mass index (BMI), which is a measurement that tells if you are at a healthy weight.  Heart rate and blood pressure.  Your skin for abnormal spots. Counseling Your health care provider may ask you questions about:  Alcohol, tobacco, and drug use.  Emotional well-being.  Home and relationship well-being.  Sexual activity.  Eating habits.  History of falls.  Memory and ability to understand (cognition).  Work and work Statistician.  Pregnancy and menstrual history. What immunizations do I need?  Influenza (flu) vaccine  This is recommended every year. Tetanus, diphtheria, and pertussis (Tdap) vaccine  You may need a Td booster every 10 years. Varicella (chickenpox) vaccine  You may need this vaccine if you have not already been vaccinated. Zoster (shingles) vaccine  You may need this after age 67. Pneumococcal conjugate (PCV13) vaccine  One dose is recommended after age 67. Pneumococcal polysaccharide (PPSV23) vaccine  One dose is recommended after age 67. Measles, mumps, and rubella (MMR) vaccine  You may need at least one dose of MMR if you were born in 1957 or later. You may also need a second dose. Meningococcal conjugate (MenACWY) vaccine  You may need this if you have certain conditions. Hepatitis A vaccine  You may need this if you have certain conditions or if you travel or work in places where you may be exposed  to hepatitis A. Hepatitis B vaccine  You may need this if you have certain conditions or if you travel or work in places where you may be exposed to hepatitis B. Haemophilus influenzae type b (Hib) vaccine  You may need this if you have certain conditions. You may receive vaccines as individual doses or as more than one vaccine together in one shot (combination vaccines). Talk with your health care provider about the risks and benefits of combination vaccines. What tests do I need? Blood tests  Lipid and cholesterol levels. These may be checked every 5 years, or more frequently depending on your overall health.  Hepatitis C test.  Hepatitis B test. Screening  Lung cancer screening. You may have this screening every year starting at age 67 if you have a 30-pack-year history of smoking and currently smoke or have quit within the past 15 years.  Colorectal cancer screening. All adults should have this screening starting at age 67 and continuing until age 15. Your health care provider may recommend screening at age 67 if you are at increased risk. You will have tests every 1-10 years, depending on your results and the type of screening test.  Diabetes screening. This is done by checking your blood sugar (glucose) after you have not eaten for a while (fasting). You may have this done every 1-3 years.  Mammogram. This may be done every 1-2 years. Talk with your health care provider about how often you should have regular mammograms.  BRCA-related cancer screening. This may be done if you have a family history of breast, ovarian, tubal, or peritoneal cancers.  Other tests  Sexually transmitted disease (STD) testing.  Bone density scan. This is done to screen for osteoporosis. You may have this done starting at age 67. Follow these instructions at home: Eating and drinking  Eat a diet that includes fresh fruits and vegetables, whole grains, lean protein, and low-fat dairy products. Limit  your intake of foods with high amounts of sugar, saturated fats, and salt.  Take vitamin and mineral supplements as recommended by your health care provider.  Do not drink alcohol if your health care provider tells you not to drink.  If you drink alcohol: ? Limit how much you have to 0-1 drink a day. ? Be aware of how much alcohol is in your drink. In the [place] one drink equals one 12 oz bottle of beer (355 mL), one 5 oz glass of wine (148 mL), or one 1 oz glass of hard liquor (44 mL). Lifestyle  Take daily care of your teeth and gums.  Stay active. Exercise for at least 30 minutes on 5 or more days each week.  Do not use any products that contain nicotine or tobacco, such as cigarettes, e-cigarettes, and chewing tobacco. If you need help quitting, ask your health care provider.  If you are sexually active, practice safe sex. Use a condom or other form of protection in order to prevent STIs (sexually transmitted infections).  Talk with your health care provider about taking a low-dose aspirin or statin. What's next?  Go to your health care provider once a year for a well check visit.  Ask your health care provider how often you should have your eyes and teeth checked.  Stay up to date on all vaccines. This information is not intended to replace advice given to you by your health care provider. Make sure you discuss any questions you have with your health care provider. Document Revised: 12/08/2018 Document Reviewed: 12/08/2018 Elsevier Patient Education  2020 [employer].

## 2020-04-11 NOTE — Progress Notes (Signed)
Name: Ashlee Mccarthy   MRN: [id number]    DOB: 04-05-1951   Date:04/11/2020       Progress Note  Subjective  Chief Complaint  Chief Complaint  Patient presents with  . Annual Exam    HPI  Patient presents for annual CPE.  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   Diet: she tries to eat healthy, but she likes sweets, she snacks instead of eating at times  Exercise:   USPSTF grade A and B recommendations    Office Visit from 04/11/2020 in [facility]  AUDIT-C Score  0     Depression: Phq 9 is  negative Depression screen [facility] 2/9 04/11/2020 10/06/2018 09/13/2018 10/13/2016 09/16/2016  Decreased Interest 0 0 0 0 0  Down, Depressed, Hopeless 0 0 0 0 0  PHQ - 2 Score 0 0 0 0 0  Altered sleeping 0 0 0 - -  Tired, decreased energy 0 0 0 - -  Change in appetite 0 0 0 - -  Feeling bad or failure about yourself  0 0 0 - -  Trouble concentrating 0 0 0 - -  Moving slowly or fidgety/restless 0 0 0 - -  Suicidal thoughts 0 0 0 - -  PHQ-9 Score 0 0 0 - -  Difficult doing work/chores - Not difficult at all Not difficult at all - -   Hypertension: BP Readings from Last 3 Encounters:  04/11/20 (!) 150/80  11/21/18 133/71  10/06/18 130/70   Obesity: Wt Readings from Last 3 Encounters:  04/11/20 150 lb 14.4 oz (68.4 kg)  11/21/18 141 lb (64 kg)  10/06/18 145 lb 12.8 oz (66.1 kg)   BMI Readings from Last 3 Encounters:  04/11/20 26.94 kg/m  11/21/18 24.20 kg/m  10/06/18 25.03 kg/m     Hep C Screening: up to date  STD testing and prevention (HIV/chl/gon/syphilis): dating someone and is sexually active but does not want to be checked for STI, discussed walk in test  Intimate partner violence: negative  Sexual History (Partners/Practices/Protection from [employer] hx STI/Pregnancy Plans): Pain during Intercourse:  Menstrual History/LMP/Abnormal Bleeding: hysterectomy  Incontinence Symptoms:    Breast cancer:  - Last Mammogram: 2017  - BRCA gene screening: N/A  Osteoporosis: Discussed high calcium and vitamin D supplementation, weight bearing exercises  Cervical cancer screening: not indicated  Skin cancer: Discussed monitoring for atypical lesions  Colorectal cancer: up to date Lung cancer:   Low Dose CT Chest recommended if Age 76-80 years, 30 pack-year currently smoking OR have quit w/in 15years. Patient does not qualify.   ECG: 2006  Advanced Care Planning: A voluntary discussion about advance care planning including the explanation and discussion of advance directives.  Discussed health care proxy and Living will, and the patient was able to identify a health care proxy as Carney Corners - daughter .  Patient does not have a living will at present time. If patient does have living will, I have requested they bring this to the clinic to be scanned in to their chart.  Lipids: Lab Results  Component Value Date   CHOL 185 10/06/2018   CHOL 196 09/27/2017   CHOL 192 09/16/2016   Lab Results  Component Value Date   HDL 63 10/06/2018   HDL 60 09/27/2017   HDL 73 09/16/2016   Lab Results  Component Value Date   LDLCALC 109 (H) 10/06/2018   LDLCALC 124 (H)  09/27/2017   LDLCALC 113 09/16/2016   Lab Results  Component Value Date   TRIG 43 10/06/2018   TRIG 40 09/27/2017   TRIG 28 09/16/2016   Lab Results  Component Value Date   CHOLHDL 2.9 10/06/2018   CHOLHDL 3.3 09/27/2017   CHOLHDL 2.6 09/16/2016   No results found for: LDLDIRECT  Glucose: Glucose  Date Value Ref Range Status  08/24/2012 90 65 - 99 mg/dL Final   Glucose, Bld  Date Value Ref Range Status  10/06/2018 82 65 - 99 mg/dL Final    Comment:    .            Fasting reference interval .   09/27/2017 90 65 - 99 mg/dL Final    Comment:    .            Fasting reference interval .   09/16/2016 77 65 - 99 mg/dL Final    Patient Active Problem List   Diagnosis Date Noted  . Encounter  for screening colonoscopy   . Benign neoplasm of ascending colon   . Osteoporosis 11/25/2016  . Fever blister 09/16/2016  . History of hysterectomy for benign disease 09/16/2016  . Menopause syndrome 09/16/2016    Past Surgical History:  Procedure Laterality Date  . ABDOMINAL HYSTERECTOMY    . bladder tack    . CATARACT EXTRACTION W/ INTRAOCULAR LENS  IMPLANT, BILATERAL    . COLONOSCOPY WITH PROPOFOL N/A 11/21/2018   Procedure: COLONOSCOPY WITH BIOPSIES;  Surgeon: Lucilla Lame, MD;  Location: [place];  Service: Endoscopy;  Laterality: N/A;  . POLYPECTOMY N/A 11/21/2018   Procedure: POLYPECTOMY;  Surgeon: Lucilla Lame, MD;  Location: [place];  Service: Endoscopy;  Laterality: N/A;  . TUBAL LIGATION      Family History  Problem Relation Age of Onset  . Cirrhosis Mother   . Lung cancer Father   . Ovarian cancer Sister   . Colon cancer Maternal Aunt   . Breast cancer Maternal Aunt        3 mat aunts  . Colon cancer Maternal Uncle     Social History   Socioeconomic History  . Marital status: Widowed    Spouse name: Not on file  . Number of children: 2  . Years of education: Not on file  . Highest education level: Not on file  Occupational History  . Occupation: Regulatory affairs officer   Tobacco Use  . Smoking status: Never Smoker  . Smokeless tobacco: Never Used  Substance and Sexual Activity  . Alcohol use: No  . Drug use: No  . Sexual activity: Not Currently  Other Topics Concern  . Not on file  Social History Narrative   She lost her second husband March [phone] from complications of PVD ( they were married for 16 years )   Two children from the first marriage   She works at [employer] and also has a Chiropractor.   Social Determinants of Health   Financial Resource Strain: Low Risk   . Difficulty of Paying Living Expenses: Not hard at all  Food Insecurity: No Food Insecurity  . Worried About Charity fundraiser in the Last Year: Never  true  . Ran Out of Food in the Last Year: Never true  Transportation Needs: No Transportation Needs  . Lack of Transportation (Medical): No  . Lack of Transportation (Non-Medical): No  Physical Activity: Sufficiently Active  . Days of Exercise per Week: 6 days  .  Minutes of Exercise per Session: 30 min  Stress: No Stress Concern Present  . Feeling of Stress : Not at all  Social Connections: Slightly Isolated  . Frequency of Communication with Friends and Family: More than three times a week  . Frequency of Social Gatherings with Friends and Family: More than three times a week  . Attends Religious Services: More than 4 times per year  . Active Member of Clubs or Organizations: Yes  . Attends Archivist Meetings: More than 4 times per year  . Marital Status: Widowed  Intimate Partner Violence: Not At Risk  . Fear of Current or Ex-Partner: No  . Emotionally Abused: No  . Physically Abused: No  . Sexually Abused: No     Current Outpatient Medications:  .  cholecalciferol (VITAMIN D) 1000 units tablet, Take 1 tablet (1,000 Units total) by mouth daily., Disp: 30 tablet, Rfl: 0 .  latanoprost (XALATAN) 0.005 % ophthalmic solution, PLACE 1 DROP INTO BOTH EYES EVERY NIGHT AT BEDTIME, Disp: , Rfl: 4  Allergies  Allergen Reactions  . Penicillins Rash     ROS  Constitutional: Negative for fever or weight change.  Respiratory: Negative for cough and shortness of breath.   Cardiovascular: Negative for chest pain or palpitations.  Gastrointestinal: Negative for abdominal pain, no bowel changes.  Musculoskeletal: Negative for gait problem or joint swelling.  Skin: Negative for rash.  Neurological: Negative for dizziness or headache.  No other specific complaints in a complete review of systems (except as listed in HPI above).  Objective  Vitals:   04/11/20 0930 04/11/20 1052  BP: (!) 150/80 (!) 150/80  Pulse: 91   Resp: 16   Temp: (!) 96.8 F (36 C)   TempSrc:  Temporal   SpO2: 96%   Weight: 150 lb 14.4 oz (68.4 kg)   Height: 5' 2.75" (1.594 m)     Body mass index is 26.94 kg/m.  Physical Exam  Constitutional: Patient appears well-developed and well-nourished. No distress.  HENT: Head: Normocephalic and atraumatic. Ears: B TMs ok, no erythema or effusion; Nose: Nose normal. Mouth/Throat: Oropharynx is clear and moist. No orop Eyes: Conjunctivae and EOM are normal. Pupils are equal, round, and reactive to light. No scleral icterus.  Neck: Normal range of motion. Neck supple. No JVD present. No thyromegaly present.  Cardiovascular: Normal rate, regular rhythm and normal heart sounds.  No murmur heard. No BLE edema. Pulmonary/Chest: Effort normal and breath sounds normal. No respiratory distress. Abdominal: Soft. Bowel sounds are normal, no distension. There is no tenderness. no masses Breast: no lumps or masses, no nipple discharge or rashes FEMALE GENITALIA: not done RECTAL: not done Musculoskeletal: Normal range of motion, no joint effusions. No gross deformities Neurological: he is alert and oriented to person, place, and time. No cranial nerve deficit. Coordination, balance, strength, speech and gait are normal.  Skin: Skin is warm and dry. No rash noted. No erythema.  Psychiatric: Patient has a normal mood and affect. behavior is normal. Judgment and thought content normal.  Fall Risk: Fall Risk  04/11/2020 10/06/2018 09/13/2018 10/13/2016 09/16/2016  Falls in the past year? 0 No No No No  Number falls in past yr: 0 - - - -  Injury with Fall? 0 - - - -     Functional Status Survey: Is the patient deaf or have difficulty hearing?: No Does the patient have difficulty seeing, even when wearing glasses/contacts?: No Does the patient have difficulty concentrating, remembering, or making decisions?: No  Does the patient have difficulty walking or climbing stairs?: No Does the patient have difficulty dressing or bathing?: No Does the patient  have difficulty doing errands alone such as visiting a doctor's office or shopping?: No  Assessment & Plan  1. Well adult exam   2. Elevated blood pressure reading  - COMPLETE METABOLIC PANEL WITH GFR - CBC with Differential/Platelet - Microalbumin / creatinine urine ratio  3. Osteoporosis without current pathological fracture, unspecified osteoporosis type  - DG Bone Density; Future  4. Need for pneumococcal vaccination  - Pneumococcal conjugate vaccine 13-valent IM  5. 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 Encounter for screening mammogram for breast cancer  - MM 3D SCREEN BREAST BILATERAL; Future  7. Pure hypercholesterolemia  - Lipid panel  8. Family history of breast cancer in female  - DG Bone Density; Future - MM 3D SCREEN BREAST BILATERAL; Future   -USPSTF grade A and B recommendations reviewed with patient; age-appropriate recommendations, preventive care, screening tests, etc discussed and encouraged; healthy living encouraged; see AVS for patient education given to patient -Discussed importance of 150 minutes of physical activity weekly, eat two servings of fish weekly, eat one serving of tree nuts ( cashews, pistachios, pecans, almonds.Marland Kitchen) every other day, eat 6 servings of fruit/vegetables daily and drink plenty of water and avoid sweet beverages.

## 2020-04-12 LAB — LIPID PANEL
Cholesterol: 189 mg/dL (ref <200)
HDL: 65 mg/dL (ref >=50)
LDL Cholesterol (Calc): 111 mg/dL (calc) — ABNORMAL HIGH
Non-HDL Cholesterol (Calc): 124 mg/dL (calc) (ref <130)
Total CHOL/HDL Ratio: 2.9 (calc) (ref <5.0)
Triglycerides: 50 mg/dL (ref <150)

## 2020-04-12 LAB — COMPLETE METABOLIC PANEL WITH GFR
AG Ratio: 2.1 (calc) (ref 1.0–2.5)
ALT: 12 U/L (ref 6–29)
AST: 20 U/L (ref 10–35)
Albumin: 4.5 g/dL (ref 3.6–5.1)
Alkaline phosphatase (APISO): 68 U/L (ref 37–153)
BUN: 11 mg/dL (ref 7–25)
CO2: 26 mmol/L (ref 20–32)
Calcium: 9.6 mg/dL (ref 8.6–10.4)
Chloride: 106 mmol/L (ref 98–110)
Creat: 0.6 mg/dL (ref 0.50–0.99)
GFR, Est African American: 110 mL/min/{1.73_m2} (ref >=60)
GFR, Est Non African American: 95 mL/min/{1.73_m2} (ref >=60)
Globulin: 2.1 g/dL (calc) (ref 1.9–3.7)
Glucose, Bld: 94 mg/dL (ref 65–99)
Potassium: 3.9 mmol/L (ref 3.5–5.3)
Sodium: 141 mmol/L (ref 135–146)
Total Bilirubin: 0.8 mg/dL (ref 0.2–1.2)
Total Protein: 6.6 g/dL (ref 6.1–8.1)

## 2020-04-12 LAB — CBC WITH DIFFERENTIAL/PLATELET
Absolute Monocytes: 494 cells/uL (ref 200–950)
Basophils Absolute: 78 cells/uL (ref 0–200)
Basophils Relative: 1.2 %
Eosinophils Absolute: 273 cells/uL (ref 15–500)
Eosinophils Relative: 4.2 %
HCT: 41.8 % (ref 35.0–45.0)
Hemoglobin: 13.7 g/dL (ref 11.7–15.5)
Lymphs Abs: 1827 cells/uL (ref 850–3900)
MCH: 30.3 pg (ref 27.0–33.0)
MCHC: 32.8 g/dL (ref 32.0–36.0)
MCV: 92.5 fL (ref 80.0–100.0)
MPV: 11.3 fL (ref 7.5–12.5)
Monocytes Relative: 7.6 %
Neutro Abs: 3829 cells/uL (ref 1500–7800)
Neutrophils Relative %: 58.9 %
Platelets: 270 10*3/uL (ref 140–400)
RBC: 4.52 10*6/uL (ref 3.80–5.10)
RDW: 12.2 % (ref 11.0–15.0)
Total Lymphocyte: 28.1 %
WBC: 6.5 10*3/uL (ref 3.8–10.8)

## 2020-04-12 LAB — MICROALBUMIN / CREATININE URINE RATIO
Creatinine, Urine: 33 mg/dL (ref 20–275)
Microalb Creat Ratio: 48 mcg/mg creat — ABNORMAL HIGH (ref <30)
Microalb, Ur: 1.6 mg/dL

## 2020-04-25 ENCOUNTER — Ambulatory Visit: Payer: Medicare HMO

## 2020-04-30 ENCOUNTER — Ambulatory Visit (INDEPENDENT_AMBULATORY_CARE_PROVIDER_SITE_OTHER): Payer: Medicare HMO

## 2020-04-30 ENCOUNTER — Other Ambulatory Visit: Payer: Self-pay

## 2020-04-30 VITALS — BP 140/68 | HR 91 | Temp 97.1°F | Resp 16 | Ht 63.0 in | Wt 150.5 lb

## 2020-04-30 DIAGNOSIS — M2042 Other hammer toe(s) (acquired), left foot: Secondary | ICD-10-CM | POA: Diagnosis not present

## 2020-04-30 DIAGNOSIS — Z0000 Encounter for general adult medical examination without abnormal findings: Secondary | ICD-10-CM

## 2020-04-30 DIAGNOSIS — M2011 Hallux valgus (acquired), right foot: Secondary | ICD-10-CM | POA: Insufficient documentation

## 2020-04-30 DIAGNOSIS — R234 Changes in skin texture: Secondary | ICD-10-CM | POA: Diagnosis not present

## 2020-04-30 DIAGNOSIS — M2012 Hallux valgus (acquired), left foot: Secondary | ICD-10-CM | POA: Diagnosis not present

## 2020-04-30 DIAGNOSIS — M79672 Pain in left foot: Secondary | ICD-10-CM | POA: Diagnosis not present

## 2020-04-30 DIAGNOSIS — M21621 Bunionette of right foot: Secondary | ICD-10-CM | POA: Diagnosis not present

## 2020-04-30 DIAGNOSIS — M2041 Other hammer toe(s) (acquired), right foot: Secondary | ICD-10-CM | POA: Diagnosis not present

## 2020-04-30 DIAGNOSIS — M79671 Pain in right foot: Secondary | ICD-10-CM | POA: Diagnosis not present

## 2020-04-30 DIAGNOSIS — M21622 Bunionette of left foot: Secondary | ICD-10-CM | POA: Diagnosis not present

## 2020-04-30 NOTE — Patient Instructions (Signed)
Ashlee Mccarthy , Thank you for taking time to come for your Medicare Wellness Visit. I appreciate your ongoing commitment to your health goals. Please review the following plan we discussed and let me know if I can assist you in the future.   Screening recommendations/referrals: Colonoscopy: done 11/21/18. Repeat in 2024 Mammogram: done 11/23/16. Please call [phone] to schedule your mammogram and bone density.  Bone Density: done 11/23/16 Recommended yearly ophthalmology/optometry visit for glaucoma screening and checkup Recommended yearly dental visit for hygiene and checkup  Vaccinations: Influenza vaccine: postponed Pneumococcal vaccine: done 04/11/20 Tdap vaccine: done 09/16/16 Shingles vaccine: Shingrix discussed. Please contact your pharmacy for coverage information.  Covid-19: done 02/08/20 & 03/04/20  Advanced directives: Advance directive discussed with you today. I have provided a copy for you to complete at home and have notarized. Once this is complete please bring a copy in to our office so we can scan it into your chart.  Conditions/risks identified: Keep up the great work!  Next appointment: Please follow up in one year for your Medicare Annual Wellness visit.     Preventive Care 44 Years and Older, Female Preventive care refers to lifestyle choices and visits with your health care provider that can promote health and wellness. What does preventive care include?  A yearly physical exam. This is also called an annual well check.  Dental exams once or twice a year.  Routine eye exams. Ask your health care provider how often you should have your eyes checked.  Personal lifestyle choices, including:  Daily care of your teeth and gums.  Regular physical activity.  Eating a healthy diet.  Avoiding tobacco and drug use.  Limiting alcohol use.  Practicing safe sex.  Taking low-dose aspirin every day.  Taking vitamin and mineral supplements as recommended by your  health care provider. What happens during an annual well check? The services and screenings done by your health care provider during your annual well check will depend on your age, overall health, lifestyle risk factors, and family history of disease. Counseling  Your health care provider may ask you questions about your:  Alcohol use.  Tobacco use.  Drug use.  Emotional well-being.  Home and relationship well-being.  Sexual activity.  Eating habits.  History of falls.  Memory and ability to understand (cognition).  Work and work Statistician.  Reproductive health. Screening  You may have the following tests or measurements:  Height, weight, and BMI.  Blood pressure.  Lipid and cholesterol levels. These may be checked every 5 years, or more frequently if you are over 77 years old.  Skin check.  Lung cancer screening. You may have this screening every year starting at age 71 if you have a 30-pack-year history of smoking and currently smoke or have quit within the past 15 years.  Fecal occult blood test (FOBT) of the stool. You may have this test every year starting at age 83.  Flexible sigmoidoscopy or colonoscopy. You may have a sigmoidoscopy every 5 years or a colonoscopy every 10 years starting at age 55.  Hepatitis C blood test.  Hepatitis B blood test.  Sexually transmitted disease (STD) testing.  Diabetes screening. This is done by checking your blood sugar (glucose) after you have not eaten for a while (fasting). You may have this done every 1-3 years.  Bone density scan. This is done to screen for osteoporosis. You may have this done starting at age 70.  Mammogram. This may be done every 1-2 years. Talk to  your health care provider about how often you should have regular mammograms. Talk with your health care provider about your test results, treatment options, and if necessary, the need for more tests. Vaccines  Your health care provider may recommend  certain vaccines, such as:  Influenza vaccine. This is recommended every year.  Tetanus, diphtheria, and acellular pertussis (Tdap, Td) vaccine. You may need a Td booster every 10 years.  Zoster vaccine. You may need this after age 27.  Pneumococcal 13-valent conjugate (PCV13) vaccine. One dose is recommended after age 72.  Pneumococcal polysaccharide (PPSV23) vaccine. One dose is recommended after age 50. Talk to your health care provider about which screenings and vaccines you need and how often you need them. This information is not intended to replace advice given to you by your health care provider. Make sure you discuss any questions you have with your health care provider. Document Released: 01/10/2016 Document Revised: 09/02/2016 Document Reviewed: 10/15/2015 Elsevier Interactive Patient Education  2017 [place] Prevention in the Home Falls can cause injuries. They can happen to people of all ages. There are many things you can do to make your home safe and to help prevent falls. What can I do on the outside of my home?  Regularly fix the edges of walkways and driveways and fix any cracks.  Remove anything that might make you trip as you walk through a door, such as a raised step or threshold.  Trim any bushes or trees on the path to your home.  Use bright outdoor lighting.  Clear any walking paths of anything that might make someone trip, such as rocks or tools.  Regularly check to see if handrails are loose or broken. Make sure that both sides of any steps have handrails.  Any raised decks and porches should have guardrails on the edges.  Have any leaves, snow, or ice cleared regularly.  Use sand or salt on walking paths during winter.  Clean up any spills in your garage right away. This includes oil or grease spills. What can I do in the bathroom?  Use night lights.  Install grab bars by the toilet and in the tub and shower. Do not use towel bars as  grab bars.  Use non-skid mats or decals in the tub or shower.  If you need to sit down in the shower, use a plastic, non-slip stool.  Keep the floor dry. Clean up any water that spills on the floor as soon as it happens.  Remove soap buildup in the tub or shower regularly.  Attach bath mats securely with double-sided non-slip rug tape.  Do not have throw rugs and other things on the floor that can make you trip. What can I do in the bedroom?  Use night lights.  Make sure that you have a light by your bed that is easy to reach.  Do not use any sheets or blankets that are too big for your bed. They should not hang down onto the floor.  Have a firm chair that has side arms. You can use this for support while you get dressed.  Do not have throw rugs and other things on the floor that can make you trip. What can I do in the kitchen?  Clean up any spills right away.  Avoid walking on wet floors.  Keep items that you use a lot in easy-to-reach places.  If you need to reach something above you, use a strong step stool that has  a grab bar.  Keep electrical cords out of the way.  Do not use floor polish or wax that makes floors slippery. If you must use wax, use non-skid floor wax.  Do not have throw rugs and other things on the floor that can make you trip. What can I do with my stairs?  Do not leave any items on the stairs.  Make sure that there are handrails on both sides of the stairs and use them. Fix handrails that are broken or loose. Make sure that handrails are as long as the stairways.  Check any carpeting to make sure that it is firmly attached to the stairs. Fix any carpet that is loose or worn.  Avoid having throw rugs at the top or bottom of the stairs. If you do have throw rugs, attach them to the floor with carpet tape.  Make sure that you have a light switch at the top of the stairs and the bottom of the stairs. If you do not have them, ask someone to add them  for you. What else can I do to help prevent falls?  Wear shoes that:  Do not have high heels.  Have rubber bottoms.  Are comfortable and fit you well.  Are closed at the toe. Do not wear sandals.  If you use a stepladder:  Make sure that it is fully opened. Do not climb a closed stepladder.  Make sure that both sides of the stepladder are locked into place.  Ask someone to hold it for you, if possible.  Clearly mark and make sure that you can see:  Any grab bars or handrails.  First and last steps.  Where the edge of each step is.  Use tools that help you move around (mobility aids) if they are needed. These include:  Canes.  Walkers.  Scooters.  Crutches.  Turn on the lights when you go into a dark area. Replace any light bulbs as soon as they burn out.  Set up your furniture so you have a clear path. Avoid moving your furniture around.  If any of your floors are uneven, fix them.  If there are any pets around you, be aware of where they are.  Review your medicines with your doctor. Some medicines can make you feel dizzy. This can increase your chance of falling. Ask your doctor what other things that you can do to help prevent falls. This information is not intended to replace advice given to you by your health care provider. Make sure you discuss any questions you have with your health care provider. Document Released: 10/10/2009 Document Revised: 05/21/2016 Document Reviewed: 01/18/2015 Elsevier Interactive Patient Education  2017 [employer].

## 2020-04-30 NOTE — Progress Notes (Addendum)
Subjective:   Ashlee Mccarthy is a 69 y.o. female who presents for Medicare Annual (Subsequent) preventive examination.  Review of Systems:   Cardiac Risk Factors include: advanced age (>59men, >53 women)     Objective:     Vitals: BP 140/68   Pulse 91   Temp (!) 97.1 F (36.2 C) (Temporal)   Resp 16   Ht 5\' 3"  (1.6 m)   Wt 150 lb 8 oz (68.3 kg)   SpO2 98%   BMI 26.66 kg/m   Body mass index is 26.66 kg/m.  Advanced Directives 04/30/2020 11/21/2018 09/27/2017 10/13/2016 09/16/2016  Does Patient Have a Medical Advance Directive? No No No No No  Would patient like information on creating a medical advance directive? Yes (MAU/Ambulatory/Procedural Areas - Information given) No - Patient declined - No - patient declined information No - patient declined information    Tobacco Social History   Tobacco Use  Smoking Status Never Smoker  Smokeless Tobacco Never Used     Counseling given: Not Answered   Clinical Intake:  Pre-visit preparation completed: Yes  Pain : No/denies pain     BMI - recorded: 26.66 Nutritional Status: BMI 25 -29 Overweight Nutritional Risks: None Diabetes: No  How often do you need to have someone help you when you read instructions, pamphlets, or other written materials from your doctor or pharmacy?: 1 - Never  Interpreter Needed?: No  Information entered by :: Clemetine Marker LPN  Past Medical History:  Diagnosis Date  . Fever blister   . Glaucoma   . PONV (postoperative nausea and vomiting)    Past Surgical History:  Procedure Laterality Date  . ABDOMINAL HYSTERECTOMY    . bladder tack    . CATARACT EXTRACTION W/ INTRAOCULAR LENS  IMPLANT, BILATERAL    . COLONOSCOPY WITH PROPOFOL N/A 11/21/2018   Procedure: COLONOSCOPY WITH BIOPSIES;  Surgeon: Lucilla Lame, MD;  Location: [facility];  Service: Endoscopy;  Laterality: N/A;  . POLYPECTOMY N/A 11/21/2018   Procedure: POLYPECTOMY;  Surgeon: Lucilla Lame, MD;  Location: [facility];  Service: Endoscopy;  Laterality: N/A;  . TUBAL LIGATION     Family History  Problem Relation Age of Onset  . Cirrhosis Mother   . Lung cancer Father   . Ovarian cancer Sister   . Colon cancer Maternal Aunt   . Breast cancer Maternal Aunt        3 mat aunts  . Colon cancer Maternal Uncle    Social History   Socioeconomic History  . Marital status: Widowed    Spouse name: Not on file  . Number of children: 2  . Years of education: Not on file  . Highest education level: Not on file  Occupational History  . Occupation: Regulatory affairs officer   Tobacco Use  . Smoking status: Never Smoker  . Smokeless tobacco: Never Used  Substance and Sexual Activity  . Alcohol use: No  . Drug use: No  . Sexual activity: Not Currently  Other Topics Concern  . Not on file  Social History Narrative   She lost her second husband March 0000000 from complications of PVD ( they were married for 16 years )   Two children from the first marriage   Owns [employer]   Social Determinants of Health   Financial Resource Strain: Low Risk   . Difficulty of Paying Living Expenses: Not hard at all  Food Insecurity: No Food Insecurity  . Worried About [employer] of  Food in the Last Year: Never true  . Ran Out of Food in the Last Year: Never true  Transportation Needs: No Transportation Needs  . Lack of Transportation (Medical): No  . Lack of Transportation (Non-Medical): No  Physical Activity: Insufficiently Active  . Days of Exercise per Week: 4 days  . Minutes of Exercise per Session: 30 min  Stress: No Stress Concern Present  . Feeling of Stress : Not at all  Social Connections: Slightly Isolated  . Frequency of Communication with Friends and Family: More than three times a week  . Frequency of Social Gatherings with Friends and Family: More than three times a week  . Attends Religious Services: More than 4 times per year  . Active Member of Clubs or Organizations: Yes  . Attends  Archivist Meetings: More than 4 times per year  . Marital Status: Widowed    Outpatient Encounter Medications as of 04/30/2020  Medication Sig  . cholecalciferol (VITAMIN D) 1000 units tablet Take 1 tablet (1,000 Units total) by mouth daily.  Marland Kitchen latanoprost (XALATAN) 0.005 % ophthalmic solution PLACE 1 DROP INTO BOTH EYES EVERY NIGHT AT BEDTIME   No facility-administered encounter medications on file as of 04/30/2020.    Activities of Daily Living In your present state of health, do you have any difficulty performing the following activities: 04/30/2020 04/11/2020  Hearing? N N  Comment declines hearing aids -  Vision? N N  Difficulty concentrating or making decisions? N N  Walking or climbing stairs? N N  Dressing or bathing? N N  Doing errands, shopping? N N  Preparing Food and eating ? N -  Using the Toilet? N -  In the past six months, have you accidently leaked urine? N -  Do you have problems with loss of bowel control? N -  Managing your Medications? N -  Managing your Finances? N -  Housekeeping or managing your Housekeeping? N -  Some recent data might be hidden    Patient Care Team: Steele Sizer, MD as PCP - General (Family Medicine) Lorelee Cover., MD (Ophthalmology) Dermatology, Dorthy Cooler, MD as Consulting Physician (Gastroenterology)    Assessment:   This is a routine wellness examination for [place].  Exercise Activities and Dietary recommendations Current Exercise Habits: Home exercise routine, Type of exercise: calisthenics;strength training/weights;Other - see comments(aerobics), Time (Minutes): 30, Frequency (Times/Week): 4, Weekly Exercise (Minutes/Week): 120, Intensity: Moderate, Exercise limited by: None identified  Goals   None     Fall Risk Fall Risk  04/30/2020 04/11/2020 10/06/2018 09/13/2018 10/13/2016  Falls in the past year? 0 0 No No No  Number falls in past yr: 0 0 - - -  Injury with Fall? 0 0 - - -  Risk for fall due  to : No Fall Risks - - - -  Follow up Falls prevention discussed - - - -   FALL RISK PREVENTION PERTAINING TO THE HOME:  Any stairs in or around the home? Yes  If so, do they handrails? Yes   Home free of loose throw rugs in walkways, pet beds, electrical cords, etc? Yes  Adequate lighting in your home to reduce risk of falls? Yes   ASSISTIVE DEVICES UTILIZED TO PREVENT FALLS:  Life alert? No  Use of a cane, walker or w/c? No  Grab bars in the bathroom? No  Shower chair or bench in shower? No  Elevated toilet seat or a handicapped toilet? No   DME ORDERS:  DME  order needed?  No   TIMED UP AND GO:  Was the test performed? Yes .  Length of time to ambulate 10 feet: 5 sec.   GAIT:  Appearance of gait: Gait stead-fast and without the use of an assistive device.   Education: Fall risk prevention has been discussed.  Intervention(s) required? No    Depression Screen PHQ 2/9 Scores 04/30/2020 04/11/2020 10/06/2018 09/13/2018  PHQ - 2 Score 0 0 0 0  PHQ- 9 Score - 0 0 0     Cognitive Function 6 CIT deferred for 2021 AWV; pt has no memory issues        Immunization History  Administered Date(s) Administered  . Influenza,inj,Quad PF,6+ Mos 09/16/2016, 10/06/2018  . PFIZER SARS-COV-2 Vaccination 02/12/2020, 03/04/2020  . Pneumococcal Conjugate-13 04/11/2020  . Tdap 09/16/2016  . Zoster 10/13/2016    Qualifies for Shingles Vaccine? Yes . Due for Shingrix. Education has been provided regarding the importance of this vaccine. Pt has been advised to call insurance company to determine out of pocket expense. Advised may also receive vaccine at local pharmacy or Health Dept. Verbalized acceptance and understanding.  Tdap: Up to date  Flu Vaccine: Due next season.   Pneumococcal Vaccine: Up to date  Covid-19 Vaccine: Up to date   Screening Tests Health Maintenance  Topic Date Due  . MAMMOGRAM  11/23/2018  . DEXA SCAN  11/23/2018  . INFLUENZA VACCINE  07/28/2020  .  PNA vac Low Risk Adult (2 of 2 - PPSV23) 04/11/2021  . COLONOSCOPY  11/22/2023  . TETANUS/TDAP  09/16/2026  . COVID-19 Vaccine  Completed  . Hepatitis C Screening  Completed    Cancer Screenings:  Colorectal Screening: Completed 11/21/18. Repeat every 5 years;  Mammogram: Completed 11/23/16. Repeat every year. Ordered 04/11/20. Pt provided with contact information and advised to call to schedule appt.   Bone Density: Completed 11/23/16. Results reflect OSTEOPOROSIS. Repeat every 2 years. Ordered 11/23/16. Pt provided with contact information and advised to call to schedule appt.   Lung Cancer Screening: (Low Dose CT Chest recommended if Age 64-80 years, 30 pack-year currently smoking OR have quit w/in 15years.) does not qualify.   Additional Screening:  Hepatitis C Screening: does qualify; Completed 09/16/16  Vision Screening: Recommended annual ophthalmology exams for early detection of glaucoma and other disorders of the eye. Is the patient up to date with their annual eye exam?  Yes  Who is the provider or what is the name of the office in which the pt attends annual eye exams? Dr. Gloriann Loan  Dental Screening: Recommended annual dental exams for proper oral hygiene  Community Resource Referral:  CRR required this visit?  No      Plan:     I have personally reviewed and addressed the Medicare Annual Wellness questionnaire and have noted the following in the patient's chart:  A. Medical and social history B. Use of alcohol, tobacco or illicit drugs  C. Current medications and supplements D. Functional ability and status E.  Nutritional status F.  Physical activity G. Advance directives H. List of other physicians I.  Hospitalizations, surgeries, and ER visits in previous 12 months J.  [place] such as hearing and vision if needed, cognitive and depression L. Referrals and appointments   In addition, I have reviewed and discussed with patient certain preventive  protocols, quality metrics, and best practice recommendations. A written personalized care plan for preventive services as well as general preventive health recommendations were provided to patient.  Signed,  Clemetine Marker, LPN Nurse Health Advisor   Nurse Notes: BP check today. 140/68. Notified Dr. Ancil Boozer. Pt was advised per Dr. Ancil Boozer to continue current regimen.

## 2020-05-13 DIAGNOSIS — R69 Illness, unspecified: Secondary | ICD-10-CM | POA: Diagnosis not present

## 2020-10-15 ENCOUNTER — Other Ambulatory Visit: Payer: Self-pay

## 2020-10-15 ENCOUNTER — Ambulatory Visit
Admission: RE | Admit: 2020-10-15 | Discharge: 2020-10-15 | Disposition: A | Discharge status: Home / Self Care | Payer: Medicare HMO | Source: Ambulatory Visit | Attending: Family Medicine | Admitting: Family Medicine

## 2020-10-15 DIAGNOSIS — Z1231 Encounter for screening mammogram for malignant neoplasm of breast: Secondary | ICD-10-CM

## 2020-10-15 DIAGNOSIS — Z803 Family history of malignant neoplasm of breast: Secondary | ICD-10-CM | POA: Diagnosis not present

## 2021-04-14 ENCOUNTER — Encounter: Payer: Medicare HMO | Admitting: Family Medicine

## 2021-04-23 NOTE — Patient Instructions (Incomplete)
Preventive Care 68 Years and Older, Female Preventive care refers to lifestyle choices and visits with your health care provider that can promote health and wellness. This includes:  A yearly physical exam. This is also called an annual wellness visit.  Regular dental and eye exams.  Immunizations.  Screening for certain conditions.  Healthy lifestyle choices, such as: ? Eating a healthy diet. ? Getting regular exercise. ? Not using drugs or products that contain nicotine and tobacco. ? Limiting alcohol use. What can I expect for my preventive care visit? Physical exam Your health care provider will check your:  Height and weight. These may be used to calculate your BMI (body mass index). BMI is a measurement that tells if you are at a healthy weight.  Heart rate and blood pressure.  Body temperature.  Skin for abnormal spots. Counseling Your health care provider may ask you questions about your:  Past medical problems.  Family's medical history.  Alcohol, tobacco, and drug use.  Emotional well-being.  Home life and relationship well-being.  Sexual activity.  Diet, exercise, and sleep habits.  History of falls.  Memory and ability to understand (cognition).  Work and work Statistician.  Pregnancy and menstrual history.  Access to firearms. What immunizations do I need? Vaccines are usually given at various ages, according to a schedule. Your health care provider will recommend vaccines for you based on your age, medical history, and lifestyle or other factors, such as travel or where you work.   What tests do I need? Blood tests  Lipid and cholesterol levels. These may be checked every 5 years, or more often depending on your overall health.  Hepatitis C test.  Hepatitis B test. Screening  Lung cancer screening. You may have this screening every year starting at age 74 if you have a 30-pack-year history of smoking and currently smoke or have quit within  the past 15 years.  Colorectal cancer screening. ? All adults should have this screening starting at age 44 and continuing until age 58. ? Your health care provider may recommend screening at age 2 if you are at increased risk. ? You will have tests every 1-10 years, depending on your results and the type of screening test.  Diabetes screening. ? This is done by checking your blood sugar (glucose) after you have not eaten for a while (fasting). ? You may have this done every 1-3 years.  Mammogram. ? This may be done every 1-2 years. ? Talk with your health care provider about how often you should have regular mammograms.  Abdominal aortic aneurysm (AAA) screening. You may need this if you are a current or former smoker.  BRCA-related cancer screening. This may be done if you have a family history of breast, ovarian, tubal, or peritoneal cancers. Other tests  STD (sexually transmitted disease) testing, if you are at risk.  Bone density scan. This is done to screen for osteoporosis. You may have this done starting at age 104. Talk with your health care provider about your test results, treatment options, and if necessary, the need for more tests. Follow these instructions at home: Eating and drinking  Eat a diet that includes fresh fruits and vegetables, whole grains, lean protein, and low-fat dairy products. Limit your intake of foods with high amounts of sugar, saturated fats, and salt.  Take vitamin and mineral supplements as recommended by your health care provider.  Do not drink alcohol if your health care provider tells you not to drink.  If you drink alcohol: ? Limit how much you have to 0-1 drink a day. ? Be aware of how much alcohol is in your drink. In the [place] one drink equals one 12 oz bottle of beer (355 mL), one 5 oz glass of wine (148 mL), or one 1 oz glass of hard liquor (44 mL).   Lifestyle  Take daily care of your teeth and gums. Brush your teeth every morning  and night with fluoride toothpaste. Floss one time each day.  Stay active. Exercise for at least 30 minutes 5 or more days each week.  Do not use any products that contain nicotine or tobacco, such as cigarettes, e-cigarettes, and chewing tobacco. If you need help quitting, ask your health care provider.  Do not use drugs.  If you are sexually active, practice safe sex. Use a condom or other form of protection in order to prevent STIs (sexually transmitted infections).  Talk with your health care provider about taking a low-dose aspirin or statin.  Find healthy ways to cope with stress, such as: ? Meditation, yoga, or listening to music. ? Journaling. ? Talking to a trusted person. ? Spending time with friends and family. Safety  Always wear your seat belt while driving or riding in a vehicle.  Do not drive: ? If you have been drinking alcohol. Do not ride with someone who has been drinking. ? When you are tired or distracted. ? While texting.  Wear a helmet and other protective equipment during sports activities.  If you have firearms in your house, make sure you follow all gun safety procedures. What's next?  Visit your health care provider once a year for an annual wellness visit.  Ask your health care provider how often you should have your eyes and teeth checked.  Stay up to date on all vaccines. This information is not intended to replace advice given to you by your health care provider. Make sure you discuss any questions you have with your health care provider. Document Revised: 12/04/2020 Document Reviewed: 12/08/2018 Elsevier Patient Education  2021 Elsevier Inc.  

## 2021-04-23 NOTE — Progress Notes (Deleted)
Name: Ashlee Mccarthy   MRN: [id number]    DOB: 1951/10/27   Date:04/23/2021       Progress Note  Subjective  Chief Complaint  Annual Exam  HPI  Patient presents for annual CPE.  Diet: *** Exercise: ***   Flowsheet Row Clinical Support from 04/30/2020 in [facility]  AUDIT-C Score 0     Depression: Phq 9 is  {Desc; negative/positive:13464::"negative"} Depression screen [facility] 2/9 04/30/2020 04/11/2020 10/06/2018 09/13/2018 10/13/2016  Decreased Interest 0 0 0 0 0  Down, Depressed, Hopeless 0 0 0 0 0  PHQ - 2 Score 0 0 0 0 0  Altered sleeping - 0 0 0 -  Tired, decreased energy - 0 0 0 -  Change in appetite - 0 0 0 -  Feeling bad or failure about yourself  - 0 0 0 -  Trouble concentrating - 0 0 0 -  Moving slowly or fidgety/restless - 0 0 0 -  Suicidal thoughts - 0 0 0 -  PHQ-9 Score - 0 0 0 -  Difficult doing work/chores - - Not difficult at all Not difficult at all -   Hypertension: BP Readings from Last 3 Encounters:  04/30/20 140/68  04/11/20 (!) 150/80  11/21/18 133/71   Obesity: Wt Readings from Last 3 Encounters:  04/30/20 150 lb 8 oz (68.3 kg)  04/11/20 150 lb 14.4 oz (68.4 kg)  11/21/18 141 lb (64 kg)   BMI Readings from Last 3 Encounters:  04/30/20 26.66 kg/m  04/11/20 26.94 kg/m  11/21/18 24.20 kg/m     Vaccines:  HPV: up to at age 20 , ask insurance if age between 14-45  Pneumonia: educated and discussed with patient. Flu: educated and discussed with patient.  Hep C Screening: 09/16/16 STD testing and prevention (HIV/chl/gon/syphilis): N/A Intimate partner violence: negative Sexual History : Menstrual History/LMP/Abnormal Bleeding:  Incontinence Symptoms:   Breast cancer:  - Last Mammogram: 10/15/20 - BRCA gene screening: N/A  Osteoporosis: Discussed high calcium and vitamin D supplementation, weight bearing exercises  Cervical cancer screening: N/A  Skin cancer: Discussed monitoring for atypical lesions  Colorectal  cancer: 11/21/18 Lung cancer:  Low Dose CT Chest recommended if Age 25-80 years, 20 pack-year currently smoking OR have quit w/in 15years. Patient does not qualify.   ECG: 10/15/2005  Advanced Care Planning: A voluntary discussion about advance care planning including the explanation and discussion of advance directives.  Discussed health care proxy and Living will, and the patient was able to identify a health care proxy as ***.  Patient does not have a living will at present time. If patient does have living will, I have requested they bring this to the clinic to be scanned in to their chart.  Lipids: Lab Results  Component Value Date   CHOL 189 04/11/2020   CHOL 185 10/06/2018   CHOL 196 09/27/2017   Lab Results  Component Value Date   HDL 65 04/11/2020   HDL 63 10/06/2018   HDL 60 09/27/2017   Lab Results  Component Value Date   LDLCALC 111 (H) 04/11/2020   LDLCALC 109 (H) 10/06/2018   LDLCALC 124 (H) 09/27/2017   Lab Results  Component Value Date   TRIG 50 04/11/2020   TRIG 43 10/06/2018   TRIG 40 09/27/2017   Lab Results  Component Value Date   CHOLHDL 2.9 04/11/2020   CHOLHDL 2.9 10/06/2018   CHOLHDL 3.3 09/27/2017   No results found for: LDLDIRECT  Glucose: Glucose  Date Value  Ref Range Status  08/24/2012 90 65 - 99 mg/dL Final   Glucose, Bld  Date Value Ref Range Status  04/11/2020 94 65 - 99 mg/dL Final    Comment:    .            Fasting reference interval .   10/06/2018 82 65 - 99 mg/dL Final    Comment:    .            Fasting reference interval .   09/27/2017 90 65 - 99 mg/dL Final    Comment:    .            Fasting reference interval .     Patient Active Problem List   Diagnosis Date Noted  . Hallux valgus, acquired, bilateral 04/30/2020  . Encounter for screening colonoscopy   . Benign neoplasm of ascending colon   . Osteoporosis 11/25/2016  . Fever blister 09/16/2016  . History of hysterectomy for benign disease 09/16/2016  .  Menopause syndrome 09/16/2016    Past Surgical History:  Procedure Laterality Date  . ABDOMINAL HYSTERECTOMY    . bladder tack    . CATARACT EXTRACTION W/ INTRAOCULAR LENS  IMPLANT, BILATERAL    . COLONOSCOPY WITH PROPOFOL N/A 11/21/2018   Procedure: COLONOSCOPY WITH BIOPSIES;  Surgeon: Lucilla Lame, MD;  Location: [place];  Service: Endoscopy;  Laterality: N/A;  . OOPHORECTOMY    . POLYPECTOMY N/A 11/21/2018   Procedure: POLYPECTOMY;  Surgeon: Lucilla Lame, MD;  Location: [place];  Service: Endoscopy;  Laterality: N/A;  . TUBAL LIGATION      Family History  Problem Relation Age of Onset  . Cirrhosis Mother   . Lung cancer Father   . Ovarian cancer Sister   . Colon cancer Maternal Aunt   . Breast cancer Maternal Aunt        3 mat aunts  . Colon cancer Maternal Uncle   . Breast cancer Other     Social History   Socioeconomic History  . Marital status: Widowed    Spouse name: Not on file  . Number of children: 2  . Years of education: Not on file  . Highest education level: Not on file  Occupational History  . Occupation: Regulatory affairs officer   Tobacco Use  . Smoking status: Never Smoker  . Smokeless tobacco: Never Used  Vaping Use  . Vaping Use: Never used  Substance and Sexual Activity  . Alcohol use: No  . Drug use: No  . Sexual activity: Not Currently  Other Topics Concern  . Not on file  Social History Narrative   She lost her second husband March 1245 from complications of PVD ( they were married for 16 years )   Two children from the first marriage   Owns [employer]   Social Determinants of Health   Financial Resource Strain: Low Risk   . Difficulty of Paying Living Expenses: Not hard at all  Food Insecurity: No Food Insecurity  . Worried About Charity fundraiser in the Last Year: Never true  . Ran Out of Food in the Last Year: Never true  Transportation Needs: No Transportation Needs  . Lack of Transportation (Medical):  No  . Lack of Transportation (Non-Medical): No  Physical Activity: Insufficiently Active  . Days of Exercise per Week: 4 days  . Minutes of Exercise per Session: 30 min  Stress: No Stress Concern Present  . Feeling of Stress : Not at all  Social Connections: Moderately Integrated  . Frequency of Communication with Friends and Family: More than three times a week  . Frequency of Social Gatherings with Friends and Family: More than three times a week  . Attends Religious Services: More than 4 times per year  . Active Member of Clubs or Organizations: Yes  . Attends Archivist Meetings: More than 4 times per year  . Marital Status: Widowed  Intimate Partner Violence: Not At Risk  . Fear of Current or Ex-Partner: No  . Emotionally Abused: No  . Physically Abused: No  . Sexually Abused: No     Current Outpatient Medications:  .  cholecalciferol (VITAMIN D) 1000 units tablet, Take 1 tablet (1,000 Units total) by mouth daily., Disp: 30 tablet, Rfl: 0 .  latanoprost (XALATAN) 0.005 % ophthalmic solution, PLACE 1 DROP INTO BOTH EYES EVERY NIGHT AT BEDTIME, Disp: , Rfl: 4  Allergies  Allergen Reactions  . Penicillins Rash     ROS  ***  Objective  There were no vitals filed for this visit.  There is no height or weight on file to calculate BMI.  Physical Exam ***  No results found for this or any previous visit (from the past 2160 hour(s)).  Diabetic Foot Exam: Diabetic Foot Exam - Simple   No data filed    ***  Fall Risk: Fall Risk  04/30/2020 04/11/2020 10/06/2018 09/13/2018 10/13/2016  Falls in the past year? 0 0 No No No  Number falls in past yr: 0 0 - - -  Injury with Fall? 0 0 - - -  Risk for fall due to : No Fall Risks - - - -  Follow up Falls prevention discussed - - - -   ***  Functional Status Survey:   ***  Assessment & Plan  There are no diagnoses linked to this encounter.  -USPSTF grade A and B recommendations reviewed with patient;  age-appropriate recommendations, preventive care, screening tests, etc discussed and encouraged; healthy living encouraged; see AVS for patient education given to patient -Discussed importance of 150 minutes of physical activity weekly, eat two servings of fish weekly, eat one serving of tree nuts ( cashews, pistachios, pecans, almonds.Marland Kitchen) every other day, eat 6 servings of fruit/vegetables daily and drink plenty of water and avoid sweet beverages.   -Reviewed Health Maintenance: ***

## 2021-04-24 ENCOUNTER — Encounter: Payer: Medicare HMO | Admitting: Family Medicine

## 2021-05-01 ENCOUNTER — Ambulatory Visit: Payer: Medicare HMO

## 2021-07-31 ENCOUNTER — Ambulatory Visit: Payer: Medicare HMO

## 2021-08-19 NOTE — Progress Notes (Signed)
Name: Ashlee Mccarthy   MRN: [id number]    DOB: 1951-11-03   Date:08/20/2021       Progress Note  Subjective  Chief Complaint  Follow Up  HPI  Microalbuminuria: she was seen last year for wellness and had microalbuminuria we will recheck labs   Osteoporosis: bone density was done in 2017, -2.8 left femur , discussed getting labs , possible therapy and decided based on bone density results . We will start Alendronate if bone density stable, otherwise refer her to Endo   Hyperlipidemia: LDL in the low 100's, not on medications, denies chest pain, bp is at goal  The 10-year ASCVD risk score Mikey Bussing DC Brooke Bonito., et al., 2013) is: 6.6%   Values used to calculate the score:     Age: 70 years     Sex: Female     Is Non-Hispanic African American: No     Diabetic: No     Tobacco smoker: No     Systolic Blood Pressure: [id number] mmHg     Is BP treated: No     HDL Cholesterol: 65 mg/dL     Total Cholesterol: 189 mg/dL    Patient Active Problem List   Diagnosis Date Noted   Hallux valgus, acquired, bilateral 04/30/2020   Encounter for screening colonoscopy    Benign neoplasm of ascending colon    Osteoporosis 11/25/2016   Fever blister 09/16/2016   History of hysterectomy for benign disease 09/16/2016   Menopause syndrome 09/16/2016    Past Surgical History:  Procedure Laterality Date   ABDOMINAL HYSTERECTOMY     bladder tack     CATARACT EXTRACTION W/ INTRAOCULAR LENS  IMPLANT, BILATERAL     COLONOSCOPY WITH PROPOFOL N/A 11/21/2018   Procedure: COLONOSCOPY WITH BIOPSIES;  Surgeon: Lucilla Lame, MD;  Location: [facility];  Service: Endoscopy;  Laterality: N/A;   OOPHORECTOMY     POLYPECTOMY N/A 11/21/2018   Procedure: POLYPECTOMY;  Surgeon: Lucilla Lame, MD;  Location: [place];  Service: Endoscopy;  Laterality: N/A;   TUBAL LIGATION      Family History  Problem Relation Age of Onset   Cirrhosis Mother    Lung cancer Father    Ovarian cancer Sister    Colon cancer  Maternal Aunt    Breast cancer Maternal Aunt        3 mat aunts   Colon cancer Maternal Uncle    Breast cancer Other     Social History   Tobacco Use   Smoking status: Never   Smokeless tobacco: Never  Substance Use Topics   Alcohol use: No     Current Outpatient Medications:    latanoprost (XALATAN) 0.005 % ophthalmic solution, PLACE 1 DROP INTO BOTH EYES EVERY NIGHT AT BEDTIME, Disp: , Rfl: 4   vitamin E 1000 UNIT capsule, Take 1,000 Units by mouth daily., Disp: , Rfl:    cholecalciferol (VITAMIN D) 1000 units tablet, Take 1 tablet (1,000 Units total) by mouth daily. (Patient not taking: Reported on 08/20/2021), Disp: 30 tablet, Rfl: 0  Allergies  Allergen Reactions   Penicillins Rash    I personally reviewed active problem list, medication list, allergies, family history, social history, health maintenance with the patient/caregiver today.   ROS  Constitutional: Negative for fever or weight change.  Respiratory: Negative for cough and shortness of breath.   Cardiovascular: Negative for chest pain or palpitations.  Gastrointestinal: Negative for abdominal pain, no bowel changes.  Musculoskeletal: Negative for gait problem or joint swelling.  Skin: Negative for rash, positive for abrasion on left arm Neurological: Negative for dizziness or headache.  No other specific complaints in a complete review of systems (except as listed in HPI above).   Objective  Vitals:   08/20/21 1333  BP: 130/82  Pulse: 87  Resp: 16  Temp: 97.7 F (36.5 C)  SpO2: 98%  Weight: 149 lb (67.6 kg)  Height: '5\' 3"'$  (1.6 m)    Body mass index is 26.39 kg/m.  Physical Exam  Constitutional: Patient appears well-developed and well-nourished. Overweight No distress.  HEENT: head atraumatic, normocephalic, pupils equal and reactive to light, neck supple Cardiovascular: Normal rate, regular rhythm and normal heart sounds.  No murmur heard. No BLE edema. Pulmonary/Chest: Effort normal and  breath sounds normal. No respiratory distress. Abdominal: Soft.  There is no tenderness. Skin: small abrasion to left upper arm.  Psychiatric: Patient has a normal mood and affect. behavior is normal. Judgment and thought content normal.    PHQ2/9: Depression screen [facility] 2/9 08/20/2021 04/30/2020 04/11/2020 10/06/2018 09/13/2018  Decreased Interest 0 0 0 0 0  Down, Depressed, Hopeless 0 0 0 0 0  PHQ - 2 Score 0 0 0 0 0  Altered sleeping - - 0 0 0  Tired, decreased energy - - 0 0 0  Change in appetite - - 0 0 0  Feeling bad or failure about yourself  - - 0 0 0  Trouble concentrating - - 0 0 0  Moving slowly or fidgety/restless - - 0 0 0  Suicidal thoughts - - 0 0 0  PHQ-9 Score - - 0 0 0  Difficult doing work/chores - - - Not difficult at all Not difficult at all    phq 9 is negative   Fall Risk: Fall Risk  08/20/2021 04/30/2020 04/11/2020 10/06/2018 09/13/2018  Falls in the past year? 0 0 0 No No  Number falls in past yr: 0 0 0 - -  Injury with Fall? 0 0 0 - -  Risk for fall due to : No Fall Risks No Fall Risks - - -  Follow up Falls prevention discussed Falls prevention discussed - - -      Functional Status Survey: Is the patient deaf or have difficulty hearing?: No Does the patient have difficulty seeing, even when wearing glasses/contacts?: No Does the patient have difficulty concentrating, remembering, or making decisions?: No Does the patient have difficulty walking or climbing stairs?: No Does the patient have difficulty dressing or bathing?: No Does the patient have difficulty doing errands alone such as visiting a doctor's office or shopping?: No    Assessment & Plan  1. Microalbuminuria  - Microalbumin / creatinine urine ratio  2. Osteoporosis without current pathological fracture, unspecified osteoporosis type  - DG Bone Density; Future - VITAMIN D 25 Hydroxy (Vit-D Deficiency, Fractures) - COMPLETE METABOLIC PANEL WITH GFR - CBC with Differential/Platelet -  Parathyroid hormone, intact (no Ca) - TSH  3. Pure hypercholesterolemia  - Lipid panel  4. Family history of breast cancer in female   7. Encounter for screening mammogram for breast cancer  - MM 3D SCREEN BREAST BILATERAL; Future  6. Need for pneumococcal vaccination  - Pneumococcal conjugate vaccine 20-valent (Prevnar 20)  7. Need for shingles vaccine  - Zoster Vaccine Adjuvanted [facility]) injection; Inject 0.5 mLs into the muscle once for 1 dose.  Dispense: 0.5 mL; Refill: 1

## 2021-08-20 ENCOUNTER — Encounter: Payer: Self-pay | Admitting: Family Medicine

## 2021-08-20 ENCOUNTER — Ambulatory Visit (INDEPENDENT_AMBULATORY_CARE_PROVIDER_SITE_OTHER): Payer: Medicare HMO | Admitting: Family Medicine

## 2021-08-20 ENCOUNTER — Other Ambulatory Visit: Payer: Self-pay

## 2021-08-20 VITALS — BP 130/82 | HR 87 | Temp 97.7°F | Resp 16 | Ht 63.0 in | Wt 149.0 lb

## 2021-08-20 DIAGNOSIS — M810 Age-related osteoporosis without current pathological fracture: Secondary | ICD-10-CM

## 2021-08-20 DIAGNOSIS — E7800 Pure hypercholesterolemia, unspecified: Secondary | ICD-10-CM

## 2021-08-20 DIAGNOSIS — R809 Proteinuria, unspecified: Secondary | ICD-10-CM | POA: Diagnosis not present

## 2021-08-20 DIAGNOSIS — Z1231 Encounter for screening mammogram for malignant neoplasm of breast: Secondary | ICD-10-CM

## 2021-08-20 DIAGNOSIS — Z23 Encounter for immunization: Secondary | ICD-10-CM

## 2021-08-20 DIAGNOSIS — Z803 Family history of malignant neoplasm of breast: Secondary | ICD-10-CM

## 2021-08-20 MED ORDER — SHINGRIX 50 MCG/0.5ML IM SUSR: 0.5000 mL | Freq: Once | INTRAMUSCULAR | 1 refills | Status: AC

## 2021-08-21 LAB — LIPID PANEL
Cholesterol: 185 mg/dL (ref <200)
HDL: 61 mg/dL (ref >=50)
LDL Cholesterol (Calc): 110 mg/dL (calc) — ABNORMAL HIGH
Non-HDL Cholesterol (Calc): 124 mg/dL (calc) (ref <130)
Total CHOL/HDL Ratio: 3 (calc) (ref <5.0)
Triglycerides: 59 mg/dL (ref <150)

## 2021-08-21 LAB — CBC WITH DIFFERENTIAL/PLATELET
Absolute Monocytes: 429 cells/uL (ref 200–950)
Basophils Absolute: 61 cells/uL (ref 0–200)
Basophils Relative: 1.1 %
Eosinophils Absolute: 286 cells/uL (ref 15–500)
Eosinophils Relative: 5.2 %
HCT: 39.4 % (ref 35.0–45.0)
Hemoglobin: 13 g/dL (ref 11.7–15.5)
Lymphs Abs: 2008 cells/uL (ref 850–3900)
MCH: 30.5 pg (ref 27.0–33.0)
MCHC: 33 g/dL (ref 32.0–36.0)
MCV: 92.5 fL (ref 80.0–100.0)
MPV: 11.4 fL (ref 7.5–12.5)
Monocytes Relative: 7.8 %
Neutro Abs: 2717 cells/uL (ref 1500–7800)
Neutrophils Relative %: 49.4 %
Platelets: 246 10*3/uL (ref 140–400)
RBC: 4.26 10*6/uL (ref 3.80–5.10)
RDW: 12.5 % (ref 11.0–15.0)
Total Lymphocyte: 36.5 %
WBC: 5.5 10*3/uL (ref 3.8–10.8)

## 2021-08-21 LAB — COMPLETE METABOLIC PANEL WITH GFR
AG Ratio: 2 (calc) (ref 1.0–2.5)
ALT: 17 U/L (ref 6–29)
AST: 30 U/L (ref 10–35)
Albumin: 4.5 g/dL (ref 3.6–5.1)
Alkaline phosphatase (APISO): 77 U/L (ref 37–153)
BUN: 11 mg/dL (ref 7–25)
CO2: 28 mmol/L (ref 20–32)
Calcium: 9.7 mg/dL (ref 8.6–10.4)
Chloride: 104 mmol/L (ref 98–110)
Creat: 0.73 mg/dL (ref 0.50–1.05)
Globulin: 2.3 g/dL (calc) (ref 1.9–3.7)
Glucose, Bld: 78 mg/dL (ref 65–99)
Potassium: 4.1 mmol/L (ref 3.5–5.3)
Sodium: 141 mmol/L (ref 135–146)
Total Bilirubin: 0.6 mg/dL (ref 0.2–1.2)
Total Protein: 6.8 g/dL (ref 6.1–8.1)
eGFR: 90 mL/min/{1.73_m2} (ref >=60)

## 2021-08-21 LAB — VITAMIN D 25 HYDROXY (VIT D DEFICIENCY, FRACTURES): Vit D, 25-Hydroxy: 48 ng/mL (ref 30–100)

## 2021-08-21 LAB — MICROALBUMIN / CREATININE URINE RATIO
Creatinine, Urine: 34 mg/dL (ref 20–275)
Microalb Creat Ratio: 9 mcg/mg creat (ref <30)
Microalb, Ur: 0.3 mg/dL

## 2021-08-21 LAB — TSH: TSH: 1.74 mIU/L (ref 0.40–4.50)

## 2021-08-21 LAB — PARATHYROID HORMONE, INTACT (NO CA): PTH: 45 pg/mL (ref 16–77)

## 2021-09-02 ENCOUNTER — Other Ambulatory Visit: Payer: Self-pay

## 2021-09-02 ENCOUNTER — Ambulatory Visit (INDEPENDENT_AMBULATORY_CARE_PROVIDER_SITE_OTHER): Payer: Medicare HMO

## 2021-09-02 ENCOUNTER — Ambulatory Visit: Payer: Medicare HMO

## 2021-09-02 VITALS — BP 128/72 | HR 72 | Temp 97.8°F | Resp 16 | Ht 63.0 in | Wt 149.7 lb

## 2021-09-02 DIAGNOSIS — Z0000 Encounter for general adult medical examination without abnormal findings: Secondary | ICD-10-CM

## 2021-09-02 NOTE — Progress Notes (Signed)
Subjective:   Ashlee Mccarthy is a 70 y.o. female who presents for Medicare Annual (Subsequent) preventive examination.  Review of Systems     Cardiac Risk Factors include: advanced age (>97mn, >>33women)     Objective:    Today's Vitals   09/02/21 1140  BP: 128/72  Pulse: 72  Resp: 16  Temp: 97.8 F (36.6 C)  TempSrc: Oral  SpO2: 97%  Weight: 149 lb 11.2 oz (67.9 kg)  Height: '5\' 3"'$  (1.6 m)   Body mass index is 26.52 kg/m.  Advanced Directives 09/02/2021 04/30/2020 11/21/2018 09/27/2017 10/13/2016 09/16/2016  Does Patient Have a Medical Advance Directive? No No No No No No  Would patient like information on creating a medical advance directive? No - Patient declined Yes (MAU/Ambulatory/Procedural Areas - Information given) No - Patient declined - No - patient declined information No - patient declined information    Current Medications (verified) Outpatient Encounter Medications as of 09/02/2021  Medication Sig   Cholecalciferol (VITAMIN D) 50 MCG (2000 UT) CAPS Take 1 capsule by mouth daily.   latanoprost (XALATAN) 0.005 % ophthalmic solution PLACE 1 DROP INTO BOTH EYES EVERY NIGHT AT BEDTIME   vitamin E 1000 UNIT capsule Take 1,000 Units by mouth daily.   No facility-administered encounter medications on file as of 09/02/2021.    Allergies (verified) Penicillins   History: Past Medical History:  Diagnosis Date   Fever blister    Glaucoma    PONV (postoperative nausea and vomiting)    Past Surgical History:  Procedure Laterality Date   ABDOMINAL HYSTERECTOMY     bladder tack     CATARACT EXTRACTION W/ INTRAOCULAR LENS  IMPLANT, BILATERAL     COLONOSCOPY WITH PROPOFOL N/A 11/21/2018   Procedure: COLONOSCOPY WITH BIOPSIES;  Surgeon: WLucilla Lame MD;  [facility]  Service: Endoscopy;  Laterality: N/A;   OOPHORECTOMY     POLYPECTOMY N/A 11/21/2018   Procedure: POLYPECTOMY;  Surgeon: WLucilla Lame MD;  [facility]  Service:  Endoscopy;  Laterality: N/A;   TUBAL LIGATION     Family History  Problem Relation Age of Onset   Cirrhosis Mother    Lung cancer Father    Cancer Sister        ovarian   Ovarian cancer Sister    Cirrhosis Sister    Cancer Sister        outside pancreas   Cancer Brother        throat and lung   Colon cancer Maternal Aunt    Breast cancer Maternal Aunt        3 mat aunts   Colon cancer Maternal Uncle    Breast cancer Other    Social History   Socioeconomic History   Marital status: Widowed    Spouse name: Not on file   Number of children: 2   Years of education: Not on file   Highest education level: Not on file  Occupational History   Occupation: rRegulatory affairs officer  Tobacco Use   Smoking status: Never   Smokeless tobacco: Never  Vaping Use   Vaping Use: Never used  Substance and Sexual Activity   Alcohol use: No   Drug use: No   Sexual activity: Not Currently  Other Topics Concern   Not on file  Social History Narrative   She lost her second husband March [id number] complications of PVD ( they were married for 16 years )   Two children from the first  marriage   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[employer]; still works there but sister has taken over so that she can care for her younger sister   Live alone   Social Determinants of Health   Financial Resource Strain: Low Risk    Difficulty of Paying Living Expenses: Not hard at all  Food Insecurity: No Food Insecurity   Worried About Charity fundraiser in the Last Year: Never true   Arboriculturist in the Last Year: Never true  Transportation Needs: No Transportation Needs   Lack of Transportation (Medical): No   Lack of Transportation (Non-Medical): No  Physical Activity: Insufficiently Active   Days of Exercise per Week: 4 days   Minutes of Exercise per Session: 30 min  Stress: No Stress Concern Present   Feeling of Stress : Not at all  Social Connections: Moderately Integrated   Frequency of Communication with Friends  and Family: More than three times a week   Frequency of Social Gatherings with Friends and Family: More than three times a week   Attends Religious Services: More than 4 times per year   Active Member of [employer] or Organizations: Yes   Attends Archivist Meetings: More than 4 times per year   Marital Status: Widowed    Tobacco Counseling Counseling given: Not Answered   Clinical Intake:  Pre-visit preparation completed: Yes  Pain : No/denies pain     BMI - recorded: 26.52 Nutritional Status: BMI 25 -29 Overweight Nutritional Risks: None Diabetes: No  How often do you need to have someone help you when you read instructions, pamphlets, or other written materials from your doctor or pharmacy?: 1 - Never    Interpreter Needed?: No  Information entered by :: Ashlee Marker LPN   Activities of Daily Living In your present state of health, do you have any difficulty performing the following activities: 09/02/2021 08/20/2021  Hearing? N N  Vision? N N  Difficulty concentrating or making decisions? N N  Walking or climbing stairs? N N  Dressing or bathing? N N  Doing errands, shopping? N N  Preparing Food and eating ? N -  Using the Toilet? N -  In the past six months, have you accidently leaked urine? N -  Do you have problems with loss of bowel control? N -  Managing your Medications? N -  Managing your Finances? N -  Housekeeping or managing your Housekeeping? N -  Some recent data might be hidden    Patient Care Team: Ashlee Sizer, MD as PCP - General (Family Medicine) Ashlee Mccarthy., MD (Ophthalmology) Dermatology, Ashlee Cooler, MD as Consulting Physician (Gastroenterology)  Indicate any recent Medical Services you may have received from other than [facility] providers in the past year (date may be approximate).     Assessment:   This is a routine wellness examination for Ashlee Mccarthy.  Hearing/Vision screen Hearing Screening - Comments:: Pt denies  hearing difficulty Vision Screening - Comments:: Annual vision screenings done by Dr. Gloriann Mccarthy  Dietary issues and exercise activities discussed: Current Exercise Habits: Home exercise routine, Type of exercise: calisthenics;strength training/weights;Other - see comments (exercise bike), Time (Minutes): 30, Frequency (Times/Week): 4, Weekly Exercise (Minutes/Week): 120, Intensity: Moderate, Exercise limited by: None identified   Goals Addressed   None    Depression Screen PHQ 2/9 Scores 09/02/2021 08/20/2021 04/30/2020 04/11/2020 10/06/2018 09/13/2018 10/13/2016  PHQ - 2 Score 0 0 0 0 0 0 0  PHQ- 9 Score - - - 0  0 0 -    Fall Risk Fall Risk  09/02/2021 08/20/2021 04/30/2020 04/11/2020 10/06/2018  Falls in the past year? 0 0 0 0 No  Number falls in past yr: 0 0 0 0 -  Injury with Fall? 0 0 0 0 -  Risk for fall due to : No Fall Risks No Fall Risks No Fall Risks - -  Follow up Falls prevention discussed Falls prevention discussed Falls prevention discussed - -    FALL RISK PREVENTION PERTAINING TO THE HOME:  Any stairs in or around the home? Yes  If so, are there any without handrails? No  Home free of loose throw rugs in walkways, pet beds, electrical cords, etc? Yes  Adequate lighting in your home to reduce risk of falls? Yes   ASSISTIVE DEVICES UTILIZED TO PREVENT FALLS:  Life alert? No  Use of a cane, walker or w/c? No  Grab bars in the bathroom? No  Shower chair or bench in shower? No  Elevated toilet seat or a handicapped toilet? No   TIMED UP AND GO:  Was the test performed? Yes .  Length of time to ambulate 10 feet: 5 sec.   Gait steady and fast without use of assistive device  Cognitive Function:        Immunizations Immunization History  Administered Date(s) Administered   Influenza,inj,Quad PF,6+ Mos 09/16/2016, 10/06/2018   PFIZER(Purple Top)SARS-COV-2 Vaccination 02/12/2020, 03/04/2020   PNEUMOCOCCAL CONJUGATE-20 08/20/2021   Pneumococcal Conjugate-13 04/11/2020    Tdap 09/16/2016   Zoster, Live 10/13/2016    TDAP status: Up to date  Flu Vaccine status: Due, Education has been provided regarding the importance of this vaccine. Advised may receive this vaccine at local pharmacy or Health Dept. Aware to provide a copy of the vaccination record if obtained from local pharmacy or Health Dept. Verbalized acceptance and understanding.  Pneumococcal vaccine status: Up to date  Covid-19 vaccine status: Completed vaccines  Qualifies for Shingles Vaccine? Yes   Zostavax completed Yes   Shingrix Completed?: No.    Education has been provided regarding the importance of this vaccine. Patient has been advised to call insurance company to determine out of pocket expense if they have not yet received this vaccine. Advised may also receive vaccine at local pharmacy or Health Dept. Verbalized acceptance and understanding.  Screening Tests Health Maintenance  Topic Date Due   Zoster Vaccines- Shingrix (1 of 2) Never done   DEXA SCAN  11/23/2018   COVID-19 Vaccine (3 - Booster for Pfizer series) 08/04/2020   INFLUENZA VACCINE  07/28/2021   PNA vac Low Risk Adult (2 of 2 - PPSV23) 08/20/2022   MAMMOGRAM  10/15/2022   COLONOSCOPY (Pts 45-56yr Insurance coverage will need to be confirmed)  11/22/2023   TETANUS/TDAP  09/16/2026   Hepatitis C Screening  Completed   HPV VACCINES  Aged Out    Health Maintenance  Health Maintenance Due  Topic Date Due   Zoster Vaccines- Shingrix (1 of 2) Never done   DEXA SCAN  11/23/2018   COVID-19 Vaccine (3 - Booster for Pfizer series) 08/04/2020   INFLUENZA VACCINE  07/28/2021    Colorectal cancer screening: Type of screening: Colonoscopy. Completed 11/21/18. Repeat every 5 years  Mammogram status: Completed 10/15/20. Repeat every year. Scheduled for 10/16/21  Bone Density status: Completed 11/23/16. Results reflect: Bone density results: OSTEOPOROSIS. Repeat every 2 years. Scheduled for 10/16/21  Lung Cancer  Screening: (Low Dose CT Chest recommended if Age 68-80years, 30 pack-year currently  smoking OR have quit w/in 15years.) does not qualify.   Additional Screening:  Hepatitis C Screening: does qualify; Completed 09/16/16  Vision Screening: Recommended annual ophthalmology exams for early detection of glaucoma and other disorders of the eye. Is the patient up to date with their annual eye exam?  Yes  Who is the provider or what is the name of the office in which the patient attends annual eye exams? Dr. Gloriann Mccarthy.   Dental Screening: Recommended annual dental exams for proper oral hygiene  Community Resource Referral / Chronic Care Management: CRR required this visit?  No   CCM required this visit?  No      Plan:     I have personally reviewed and noted the following in the patient's chart:   Medical and social history Use of alcohol, tobacco or illicit drugs  Current medications and supplements including opioid prescriptions.  Functional ability and status Nutritional status Physical activity Advanced directives List of other physicians Hospitalizations, surgeries, and ER visits in previous 12 months Vitals Screenings to include cognitive, depression, and falls Referrals and appointments  In addition, I have reviewed and discussed with patient certain preventive protocols, quality metrics, and best practice recommendations. A written personalized care plan for preventive services as well as general preventive health recommendations were provided to patient.     Ashlee Marker, LPN   [id number]   Nurse Notes: none

## 2021-09-02 NOTE — Patient Instructions (Signed)
Ashlee Mccarthy , Thank you for taking time to come for your Medicare Wellness Visit. I appreciate your ongoing commitment to your health goals. Please review the following plan we discussed and let me know if I can assist you in the future.   Screening recommendations/referrals: Colonoscopy: done 11/21/18. Repeat 10/2023 Mammogram: done 10/15/20. Scheduled for 10/16/21 Bone Density: done 11/23/16. Scheduled for 10/16/21 Recommended yearly ophthalmology/optometry visit for glaucoma screening and checkup Recommended yearly dental visit for hygiene and checkup  Vaccinations: Influenza vaccine: due Pneumococcal vaccine: done 08/20/21 Tdap vaccine: done 09/16/16 Shingles vaccine: Shingrix discussed. Please contact your pharmacy for coverage information.  Covid-19: done 02/12/20 & 03/04/20  Advanced directives: Advance directive discussed with you today. Even though you declined this today please call our office should you change your mind and we can give you the proper paperwork for you to fill out.   Conditions/risks identified: Keep up the great work!  Next appointment: Follow up in one year for your annual wellness visit    Preventive Care 65 Years and Older, Female Preventive care refers to lifestyle choices and visits with your health care provider that can promote health and wellness. What does preventive care include? A yearly physical exam. This is also called an annual well check. Dental exams once or twice a year. Routine eye exams. Ask your health care provider how often you should have your eyes checked. Personal lifestyle choices, including: Daily care of your teeth and gums. Regular physical activity. Eating a healthy diet. Avoiding tobacco and drug use. Limiting alcohol use. Practicing safe sex. Taking low-dose aspirin every day. Taking vitamin and mineral supplements as recommended by your health care provider. What happens during an annual well check? The services and  screenings done by your health care provider during your annual well check will depend on your age, overall health, lifestyle risk factors, and family history of disease. Counseling  Your health care provider may ask you questions about your: Alcohol use. Tobacco use. Drug use. Emotional well-being. Home and relationship well-being. Sexual activity. Eating habits. History of falls. Memory and ability to understand (cognition). Work and work Statistician. Reproductive health. Screening  You may have the following tests or measurements: Height, weight, and BMI. Blood pressure. Lipid and cholesterol levels. These may be checked every 5 years, or more frequently if you are over 75 years old. Skin check. Lung cancer screening. You may have this screening every year starting at age 34 if you have a 30-pack-year history of smoking and currently smoke or have quit within the past 15 years. Fecal occult blood test (FOBT) of the stool. You may have this test every year starting at age 70. Flexible sigmoidoscopy or colonoscopy. You may have a sigmoidoscopy every 5 years or a colonoscopy every 10 years starting at age 63. Hepatitis C blood test. Hepatitis B blood test. Sexually transmitted disease (STD) testing. Diabetes screening. This is done by checking your blood sugar (glucose) after you have not eaten for a while (fasting). You may have this done every 1-3 years. Bone density scan. This is done to screen for osteoporosis. You may have this done starting at age 74. Mammogram. This may be done every 1-2 years. Talk to your health care provider about how often you should have regular mammograms. Talk with your health care provider about your test results, treatment options, and if necessary, the need for more tests. Vaccines  Your health care provider may recommend certain vaccines, such as: Influenza vaccine. This is recommended every  year. Tetanus, diphtheria, and acellular pertussis (Tdap,  Td) vaccine. You may need a Td booster every 10 years. Zoster vaccine. You may need this after age 56. Pneumococcal 13-valent conjugate (PCV13) vaccine. One dose is recommended after age 30. Pneumococcal polysaccharide (PPSV23) vaccine. One dose is recommended after age 51. Talk to your health care provider about which screenings and vaccines you need and how often you need them. This information is not intended to replace advice given to you by your health care provider. Make sure you discuss any questions you have with your health care provider. Document Released: 01/10/2016 Document Revised: 09/02/2016 Document Reviewed: 10/15/2015 Elsevier Interactive Patient Education  2017 [employer] Prevention in the Home Falls can cause injuries. They can happen to people of all ages. There are many things you can do to make your home safe and to help prevent falls. What can I do on the outside of my home? Regularly fix the edges of walkways and driveways and fix any cracks. Remove anything that might make you trip as you walk through a door, such as a raised step or threshold. Trim any bushes or trees on the path to your home. Use bright outdoor lighting. Clear any walking paths of anything that might make someone trip, such as rocks or tools. Regularly check to see if handrails are loose or broken. Make sure that both sides of any steps have handrails. Any raised decks and porches should have guardrails on the edges. Have any leaves, snow, or ice cleared regularly. Use sand or salt on walking paths during winter. Clean up any spills in your garage right away. This includes oil or grease spills. What can I do in the bathroom? Use night lights. Install grab bars by the toilet and in the tub and shower. Do not use towel bars as grab bars. Use non-skid mats or decals in the tub or shower. If you need to sit down in the shower, use a plastic, non-slip stool. Keep the floor dry. Clean up any  water that spills on the floor as soon as it happens. Remove soap buildup in the tub or shower regularly. Attach bath mats securely with double-sided non-slip rug tape. Do not have throw rugs and other things on the floor that can make you trip. What can I do in the bedroom? Use night lights. Make sure that you have a light by your bed that is easy to reach. Do not use any sheets or blankets that are too big for your bed. They should not hang down onto the floor. Have a firm chair that has side arms. You can use this for support while you get dressed. Do not have throw rugs and other things on the floor that can make you trip. What can I do in the kitchen? Clean up any spills right away. Avoid walking on wet floors. Keep items that you use a lot in easy-to-reach places. If you need to reach something above you, use a strong step stool that has a grab bar. Keep electrical cords out of the way. Do not use floor polish or wax that makes floors slippery. If you must use wax, use non-skid floor wax. Do not have throw rugs and other things on the floor that can make you trip. What can I do with my stairs? Do not leave any items on the stairs. Make sure that there are handrails on both sides of the stairs and use them. Fix handrails that are broken or  loose. Make sure that handrails are as long as the stairways. Check any carpeting to make sure that it is firmly attached to the stairs. Fix any carpet that is loose or worn. Avoid having throw rugs at the top or bottom of the stairs. If you do have throw rugs, attach them to the floor with carpet tape. Make sure that you have a light switch at the top of the stairs and the bottom of the stairs. If you do not have them, ask someone to add them for you. What else can I do to help prevent falls? Wear shoes that: Do not have high heels. Have rubber bottoms. Are comfortable and fit you well. Are closed at the toe. Do not wear sandals. If you use a  stepladder: Make sure that it is fully opened. Do not climb a closed stepladder. Make sure that both sides of the stepladder are locked into place. Ask someone to hold it for you, if possible. Clearly mark and make sure that you can see: Any grab bars or handrails. First and last steps. Where the edge of each step is. Use tools that help you move around (mobility aids) if they are needed. These include: Canes. Walkers. Scooters. Crutches. Turn on the lights when you go into a dark area. Replace any light bulbs as soon as they burn out. Set up your furniture so you have a clear path. Avoid moving your furniture around. If any of your floors are uneven, fix them. If there are any pets around you, be aware of where they are. Review your medicines with your doctor. Some medicines can make you feel dizzy. This can increase your chance of falling. Ask your doctor what other things that you can do to help prevent falls. This information is not intended to replace advice given to you by your health care provider. Make sure you discuss any questions you have with your health care provider. Document Released: 10/10/2009 Document Revised: 05/21/2016 Document Reviewed: 01/18/2015 Elsevier Interactive Patient Education  2017 [employer].

## 2021-10-14 ENCOUNTER — Other Ambulatory Visit: Payer: Self-pay

## 2021-10-14 ENCOUNTER — Ambulatory Visit (INDEPENDENT_AMBULATORY_CARE_PROVIDER_SITE_OTHER): Payer: Medicare HMO | Admitting: Nurse Practitioner

## 2021-10-14 ENCOUNTER — Encounter: Payer: Self-pay | Admitting: Nurse Practitioner

## 2021-10-14 VITALS — BP 110/82 | HR 68 | Temp 97.8°F | Resp 16 | Ht 63.0 in | Wt 149.7 lb

## 2021-10-14 DIAGNOSIS — R350 Frequency of micturition: Secondary | ICD-10-CM | POA: Diagnosis not present

## 2021-10-14 LAB — POCT URINALYSIS DIPSTICK
Bilirubin, UA: NEGATIVE
Glucose, UA: NEGATIVE
Ketones, UA: POSITIVE
Nitrite, UA: NEGATIVE
Protein, UA: POSITIVE — AB
Spec Grav, UA: 1.02 (ref 1.010–1.025)
Urobilinogen, UA: 0.2 E.U./dL
pH, UA: 5 (ref 5.0–8.0)

## 2021-10-14 MED ORDER — SULFAMETHOXAZOLE-TRIMETHOPRIM 800-160 MG PO TABS: 1.0000 | ORAL_TABLET | Freq: Two times a day (BID) | ORAL | 0 refills | Status: AC

## 2021-10-14 NOTE — Progress Notes (Signed)
BP 110/82   Pulse 68   Temp 97.8 F (36.6 C) (Oral)   Resp 16   Ht 5\' 3"  (1.6 m)   Wt 149 lb 11.2 oz (67.9 kg)   SpO2 99%   BMI 26.52 kg/m    Subjective:    Patient ID: Ashlee Mccarthy, female    DOB: [birth year], 70 y.o.   MRN: [id number]  HPI: Ashlee Mccarthy is a 70 y.o. female, here alone  Chief Complaint  Patient presents with   Urinary Frequency    Burning,    Abdominal Pain   Urinary Frequency/Urgency/Dysuria: She says she had urinary frequency, urgency and dysuria for the last few days.  She denies any vaginal discharge, hematuria or flank pain. She denies any fever or chills.  She is sexually active with one partner occasionally but not recently.  Discussed urine dip stick results with patient and will send off for culture.    Relevant past medical, surgical, family and social history reviewed and updated as indicated. Interim medical history since our last visit reviewed. Allergies and medications reviewed and updated.  Review of Systems  Constitutional: Negative for fever or weight change.  Respiratory: Negative for cough and shortness of breath.   Cardiovascular: Negative for chest pain or palpitations.  Gastrointestinal: Negative for abdominal pain, no bowel changes.  Genitourinary: Positive for urinary frequency, urgency and dysuria. Negative for vaginal discharge Musculoskeletal: Negative for gait problem or joint swelling.  Skin: Negative for rash.  Neurological: Negative for dizziness or headache.  No other specific complaints in a complete review of systems (except as listed in HPI above).      Objective:    BP 110/82   Pulse 68   Temp 97.8 F (36.6 C) (Oral)   Resp 16   Ht 5\' 3"  (1.6 m)   Wt 149 lb 11.2 oz (67.9 kg)   SpO2 99%   BMI 26.52 kg/m   Wt Readings from Last 3 Encounters:  10/14/21 149 lb 11.2 oz (67.9 kg)  09/02/21 149 lb 11.2 oz (67.9 kg)  08/20/21 149 lb (67.6 kg)    Physical Exam  Constitutional: Patient appears  well-developed and well-nourished. No distress.  HEENT: head atraumatic, normocephalic, pupils equal and reactive to light, neck supple Cardiovascular: Normal rate, regular rhythm and normal heart sounds.  No murmur heard. No BLE edema. Pulmonary/Chest: Effort normal and breath sounds normal. No respiratory distress. Abdominal: Soft.  There is no tenderness. No CVA tenderness Psychiatric: Patient has a normal mood and affect. behavior is normal. Judgment and thought content normal.   Results for orders placed or performed in visit on 10/14/21  POCT urinalysis dipstick  Result Value Ref Range   Color, UA gold    Clarity, UA cloudy    Glucose, UA Negative Negative   Bilirubin, UA negative    Ketones, UA positive    Spec Grav, UA 1.020 1.010 - 1.025   Blood, UA large    pH, UA 5.0 5.0 - 8.0   Protein, UA Positive (A) Negative   Urobilinogen, UA 0.2 0.2 or 1.0 E.U./dL   Nitrite, UA negative    Leukocytes, UA Trace (A) Negative   Appearance cloudy    Odor strong       Assessment & Plan:   1. Urine frequency  - POCT urinalysis dipstick - sulfamethoxazole-trimethoprim (BACTRIM DS) 800-160 MG tablet; Take 1 tablet by mouth 2 (two) times daily for 5 days.  Dispense: 10 tablet; Refill: 0 - Urine Culture  Follow up plan: Return if symptoms worsen or fail to improve.

## 2021-10-15 LAB — URINE CULTURE
MICRO NUMBER:: 12518267
SPECIMEN QUALITY:: ADEQUATE

## 2021-10-16 ENCOUNTER — Ambulatory Visit
Admission: RE | Admit: 2021-10-16 | Discharge: 2021-10-16 | Disposition: A | Discharge status: Home / Self Care | Payer: Medicare HMO | Source: Ambulatory Visit | Attending: Family Medicine | Admitting: Family Medicine

## 2021-10-16 ENCOUNTER — Other Ambulatory Visit: Payer: Self-pay

## 2021-10-16 DIAGNOSIS — Z1231 Encounter for screening mammogram for malignant neoplasm of breast: Secondary | ICD-10-CM | POA: Diagnosis not present

## 2021-10-16 DIAGNOSIS — M810 Age-related osteoporosis without current pathological fracture: Secondary | ICD-10-CM | POA: Diagnosis not present

## 2021-10-16 DIAGNOSIS — Z780 Asymptomatic menopausal state: Secondary | ICD-10-CM | POA: Diagnosis not present

## 2021-11-11 ENCOUNTER — Encounter: Payer: Self-pay | Admitting: Family Medicine

## 2021-11-11 ENCOUNTER — Ambulatory Visit (INDEPENDENT_AMBULATORY_CARE_PROVIDER_SITE_OTHER): Payer: Medicare HMO | Admitting: Family Medicine

## 2021-11-11 ENCOUNTER — Other Ambulatory Visit: Payer: Self-pay

## 2021-11-11 VITALS — BP 132/72 | HR 92 | Temp 98.0°F | Resp 16 | Ht 63.0 in | Wt 151.0 lb

## 2021-11-11 DIAGNOSIS — R350 Frequency of micturition: Secondary | ICD-10-CM

## 2021-11-11 DIAGNOSIS — R300 Dysuria: Secondary | ICD-10-CM | POA: Diagnosis not present

## 2021-11-11 DIAGNOSIS — Z23 Encounter for immunization: Secondary | ICD-10-CM

## 2021-11-11 DIAGNOSIS — R0989 Other specified symptoms and signs involving the circulatory and respiratory systems: Secondary | ICD-10-CM | POA: Diagnosis not present

## 2021-11-11 DIAGNOSIS — Z780 Asymptomatic menopausal state: Secondary | ICD-10-CM

## 2021-11-11 LAB — POCT URINALYSIS DIPSTICK
Bilirubin, UA: NEGATIVE
Blood, UA: NEGATIVE
Glucose, UA: NEGATIVE
Ketones, UA: NEGATIVE
Leukocytes, UA: NEGATIVE
Nitrite, UA: NEGATIVE
Protein, UA: NEGATIVE
Spec Grav, UA: 1.01 (ref 1.010–1.025)
Urobilinogen, UA: 0.2 E.U./dL
pH, UA: 5 (ref 5.0–8.0)

## 2021-11-11 MED ORDER — SHINGRIX 50 MCG/0.5ML IM SUSR: 0.5000 mL | Freq: Once | INTRAMUSCULAR | 1 refills | Status: AC

## 2021-11-11 MED ORDER — PREMARIN 0.625 MG/GM VA CREA: 1.0000 | TOPICAL_CREAM | VAGINAL | 0 refills | Status: DC

## 2021-11-11 NOTE — Progress Notes (Signed)
Name: GLENDENE WYER   MRN: [id number]    DOB: 07/15/1951   Date:11/11/2021       Progress Note  Subjective  Chief Complaint  UTI  HPI  Dysuria: she has recurrent episodes of dysuria, but last culture was negative. Symptoms this time started last week but improved with AZO, but this morning same discomfort , associated with hesitancy and dysuria. She started to drink a lot of water and felt better, at this time very mild discomfort. No fever or chills. No nausea or vomiting  Patient Active Problem List   Diagnosis Date Noted   Hallux valgus, acquired, bilateral 04/30/2020   Benign neoplasm of ascending colon    Osteoporosis 11/25/2016   Fever blister 09/16/2016   History of hysterectomy for benign disease 09/16/2016   Menopause syndrome 09/16/2016    Past Surgical History:  Procedure Laterality Date   ABDOMINAL HYSTERECTOMY     bladder tack     CATARACT EXTRACTION W/ INTRAOCULAR LENS  IMPLANT, BILATERAL     COLONOSCOPY WITH PROPOFOL N/A 11/21/2018   Procedure: COLONOSCOPY WITH BIOPSIES;  Surgeon: Lucilla Lame, MD;  Location: [place];  Service: Endoscopy;  Laterality: N/A;   OOPHORECTOMY     POLYPECTOMY N/A 11/21/2018   Procedure: POLYPECTOMY;  Surgeon: Lucilla Lame, MD;  Location: [place];  Service: Endoscopy;  Laterality: N/A;   TUBAL LIGATION      Family History  Problem Relation Age of Onset   Cirrhosis Mother    Lung cancer Father    Cancer Sister        ovarian   Ovarian cancer Sister    Cirrhosis Sister    Cancer Sister        outside pancreas   Cancer Brother        throat and lung   Colon cancer Maternal Aunt    Breast cancer Maternal Aunt        3 mat aunts   Colon cancer Maternal Uncle    Breast cancer Other     Social History   Tobacco Use   Smoking status: Never   Smokeless tobacco: Never  Substance Use Topics   Alcohol use: No     Current Outpatient Medications:    Cholecalciferol (VITAMIN D) 50 MCG (2000 UT)  CAPS, Take 1 capsule by mouth daily., Disp: , Rfl:    latanoprost (XALATAN) 0.005 % ophthalmic solution, PLACE 1 DROP INTO BOTH EYES EVERY NIGHT AT BEDTIME, Disp: , Rfl: 4   vitamin E 1000 UNIT capsule, Take 1,000 Units by mouth daily., Disp: , Rfl:   Allergies  Allergen Reactions   Penicillins Rash    I personally reviewed active problem list, medication list, allergies, family history, social history, health maintenance with the patient/caregiver today.   ROS  Ten systems reviewed and is negative except as mentioned in HPI   Objective  Vitals:   11/11/21 1432  BP: 132/72  Pulse: 92  Resp: 16  Temp: 98 F (36.7 C)  SpO2: 98%  Weight: 151 lb (68.5 kg)  Height: 5' 3"  (1.6 m)    Body mass index is 26.75 kg/m.  Physical Exam  Constitutional: Patient appears well-developed and well-nourished.  No distress.  HEENT: head atraumatic, normocephalic, pupils equal and reactive to light, neck supple Cardiovascular: Normal rate, regular rhythm and normal heart sounds.  No murmur heard. No BLE edema. Pulmonary/Chest: Effort normal but has scattered rhonchi  No respiratory distress. Abdominal: Soft.  There is no tenderness. Negative CVA tenderness  Psychiatric: Patient has a normal mood and affect. behavior is normal. Judgment and thought content normal.   Recent Results (from the past 2160 hour(s))  VITAMIN D 25 Hydroxy (Vit-D Deficiency, Fractures)     Status: None   Collection Time: 08/20/21  2:01 PM  Result Value Ref Range   Vit D, 25-Hydroxy 48 30 - 100 ng/mL    Comment: Vitamin D Status         25-OH Vitamin D: . Deficiency:                    <20 ng/mL Insufficiency:             20 - 29 ng/mL Optimal:                 > or = 30 ng/mL . For 25-OH Vitamin D testing on patients on  D2-supplementation and patients for whom quantitation  of D2 and D3 fractions is required, the Quest[facility]ureD(TM) 25-OH VIT D, (D2,D3), LC/MS/MS is recommended: order  code [id number] (patients  >54yr). See Note 1 . Note 1 . For additional information, please refer to  http://education.[facility].com/faq/FAQ199  (This link is being provided for informational/ educational purposes only.)   COMPLETE METABOLIC PANEL WITH GFR     Status: None   Collection Time: 08/20/21  2:01 PM  Result Value Ref Range   Glucose, Bld 78 65 - 99 mg/dL    Comment: .            Fasting reference interval .    BUN 11 7 - 25 mg/dL   Creat 0.73 0.50 - 1.05 mg/dL   eGFR 90 > OR = 60 mL/min/1.75m   Comment: The eGFR is based on the CKD-EPI 2021 equation. To calculate  the new eGFR from a previous Creatinine or Cystatin C result, go to https://www.kidney.org/professionals/ kdoqi/gfr%5Fcalculator    BUN/Creatinine Ratio NOT APPLICABLE 6 - 22 (calc)   Sodium 141 135 - 146 mmol/L   Potassium 4.1 3.5 - 5.3 mmol/L   Chloride 104 98 - 110 mmol/L   CO2 28 20 - 32 mmol/L   Calcium 9.7 8.6 - 10.4 mg/dL   Total Protein 6.8 6.1 - 8.1 g/dL   Albumin 4.5 3.6 - 5.1 g/dL   Globulin 2.3 1.9 - 3.7 g/dL (calc)   AG Ratio 2.0 1.0 - 2.5 (calc)   Total Bilirubin 0.6 0.2 - 1.2 mg/dL   Alkaline phosphatase (APISO) 77 37 - 153 U/L   AST 30 10 - 35 U/L   ALT 17 6 - 29 U/L  CBC with Differential/Platelet     Status: None   Collection Time: 08/20/21  2:01 PM  Result Value Ref Range   WBC 5.5 3.8 - 10.8 Thousand/uL   RBC 4.26 3.80 - 5.10 Million/uL   Hemoglobin 13.0 11.7 - 15.5 g/dL   HCT 39.4 35.0 - 45.0 %   MCV 92.5 80.0 - 100.0 fL   MCH 30.5 27.0 - 33.0 pg   MCHC 33.0 32.0 - 36.0 g/dL   RDW 12.5 11.0 - 15.0 %   Platelets 246 140 - 400 Thousand/uL   MPV 11.4 7.5 - 12.5 fL   Neutro Abs 2,717 1,500 - 7,800 cells/uL   Lymphs Abs 2,008 850 - 3,900 cells/uL   Absolute Monocytes 429 200 - 950 cells/uL   Eosinophils Absolute 286 15 - 500 cells/uL   Basophils Absolute 61 0 - 200 cells/uL   Neutrophils Relative % 49.4 %   Total Lymphocyte  36.5 %   Monocytes Relative 7.8 %   Eosinophils Relative 5.2 %    Basophils Relative 1.1 %  Lipid panel     Status: Abnormal   Collection Time: 08/20/21  2:01 PM  Result Value Ref Range   Cholesterol 185 <200 mg/dL   HDL 61 > OR = 50 mg/dL   Triglycerides 59 <150 mg/dL   LDL Cholesterol (Calc) 110 (H) mg/dL (calc)    Comment: Reference range: <100 . Desirable range <100 mg/dL for primary prevention;   <70 mg/dL for patients with CHD or diabetic patients  with > or = 2 CHD risk factors. Marland Kitchen LDL-C is now calculated using the Martin-Hopkins  calculation, which is a validated novel method providing  better accuracy than the Friedewald equation in the  estimation of LDL-C.  Cresenciano Genre et al. Annamaria Helling. 0539;767(34): 2061-2068  (http://education.[facility].com/faq/FAQ164)    Total CHOL/HDL Ratio 3.0 <5.0 (calc)   Non-HDL Cholesterol (Calc) 124 <130 mg/dL (calc)    Comment: For patients with diabetes plus 1 major ASCVD risk  factor, treating to a non-HDL-C goal of <100 mg/dL  (LDL-C of <70 mg/dL) is considered a therapeutic  option.   Parathyroid hormone, intact (no Ca)     Status: None   Collection Time: 08/20/21  2:01 PM  Result Value Ref Range   PTH 45 16 - 77 pg/mL    Comment: . Interpretive Guide    Intact PTH           Calcium ------------------    ----------           ------- Normal Parathyroid    Normal               Normal Hypoparathyroidism    Low or Low Normal    Low Hyperparathyroidism    Primary            Normal or High       High    Secondary          High                 Normal or Low    Tertiary           High                 High Non-Parathyroid    Hypercalcemia      Low or Low Normal    High .   TSH     Status: None   Collection Time: 08/20/21  2:01 PM  Result Value Ref Range   TSH 1.74 0.40 - 4.50 mIU/L  Microalbumin / creatinine urine ratio     Status: None   Collection Time: 08/20/21  2:01 PM  Result Value Ref Range   Creatinine, Urine 34 20 - 275 mg/dL   Microalb, Ur 0.3 mg/dL    Comment: Reference Range Not  established    Microalb Creat Ratio 9 <30 mcg/mg creat    Comment: . The ADA defines abnormalities in albumin excretion as follows: Marland Kitchen Albuminuria Category        Result (mcg/mg creatinine) . Normal to Mildly increased   <30 Moderately increased         30-299  Severely increased           > OR = 300 . The ADA recommends that at least two of three specimens collected within a 3-6 month period be abnormal before considering a patient to be within a diagnostic category.   POCT  urinalysis dipstick     Status: Abnormal   Collection Time: 10/14/21  1:48 PM  Result Value Ref Range   Color, UA gold    Clarity, UA cloudy    Glucose, UA Negative Negative   Bilirubin, UA negative    Ketones, UA positive    Spec Grav, UA 1.020 1.010 - 1.025   Blood, UA large    pH, UA 5.0 5.0 - 8.0   Protein, UA Positive (A) Negative   Urobilinogen, UA 0.2 0.2 or 1.0 E.U./dL   Nitrite, UA negative    Leukocytes, UA Trace (A) Negative   Appearance cloudy    Odor strong   Urine Culture     Status: None   Collection Time: 10/14/21  2:11 PM   Specimen: Urine  Result Value Ref Range   MICRO NUMBER: [id number]    SPECIMEN QUALITY: Adequate    Sample Source URINE    STATUS: FINAL    ISOLATE 1:      Less than 10,000 CFU/mL of single Gram positive organism isolated. No further testing will be performed. If clinically indicated, recollection using a method to minimize contamination, with prompt transfer to Urine Culture Transport Tube, is recommended.  POCT Urinalysis Dipstick     Status: Normal   Collection Time: 11/11/21  2:20 PM  Result Value Ref Range   Color, UA Pale Yellow    Clarity, UA Clear    Glucose, UA Negative Negative   Bilirubin, UA Negative    Ketones, UA Negative    Spec Grav, UA 1.010 1.010 - 1.025   Blood, UA Negative    pH, UA 5.0 5.0 - 8.0   Protein, UA Negative Negative   Urobilinogen, UA 0.2 0.2 or 1.0 E.U./dL   Nitrite, UA Negative    Leukocytes, UA Negative Negative    Appearance Clear    Odor No      PHQ2/9: Depression screen [facility] 2/9 11/11/2021 09/02/2021 08/20/2021 04/30/2020 04/11/2020  Decreased Interest 0 0 0 0 0  Down, Depressed, Hopeless 0 0 0 0 0  PHQ - 2 Score 0 0 0 0 0  Altered sleeping 0 - - - 0  Tired, decreased energy 0 - - - 0  Change in appetite 0 - - - 0  Feeling bad or failure about yourself  0 - - - 0  Trouble concentrating 0 - - - 0  Moving slowly or fidgety/restless 0 - - - 0  Suicidal thoughts 0 - - - 0  PHQ-9 Score 0 - - - 0  Difficult doing work/chores - - - - -    phq 9 is negative   Fall Risk: Fall Risk  11/11/2021 09/02/2021 08/20/2021 04/30/2020 04/11/2020  Falls in the past year? 0 0 0 0 0  Number falls in past yr: 0 0 0 0 0  Injury with Fall? 0 0 0 0 0  Risk for fall due to : No Fall Risks No Fall Risks No Fall Risks No Fall Risks -  Follow up Falls prevention discussed Falls prevention discussed Falls prevention discussed Falls prevention discussed -      Functional Status Survey: Is the patient deaf or have difficulty hearing?: No Does the patient have difficulty seeing, even when wearing glasses/contacts?: No Does the patient have difficulty concentrating, remembering, or making decisions?: No Does the patient have difficulty walking or climbing stairs?: No Does the patient have difficulty dressing or bathing?: No Does the patient have difficulty doing errands alone such as visiting  a doctor's office or shopping?: No    Assessment & Plan  1. Urine frequency  - POCT Urinalysis Dipstick - CULTURE, URINE COMPREHENSIVE  2. Dysuria  - POCT Urinalysis Dipstick - CULTURE, URINE COMPREHENSIVE - conjugated estrogens (PREMARIN) vaginal cream; Place 1 Applicatorful vaginally 3 (three) times a week. Use a pea size on urethra also  Dispense: 42.5 g; Refill: 0  3. Post-menopausal  - conjugated estrogens (PREMARIN) vaginal cream; Place 1 Applicatorful vaginally 3 (three) times a week. Use a pea size on urethra also   Dispense: 42.5 g; Refill: 0   5. Need for shingles vaccine  - Zoster Vaccine Adjuvanted [facility]) injection; Inject 0.5 mLs into the muscle once for 1 dose.  Dispense: 0.5 mL; Refill: 1  6. Rhonchi  Coarse and scattered, moves with cough, no symptoms at home, incidental finding. Discussed taking deep breaths at home every hour, monitor for symptoms and recheck next visit

## 2021-11-13 LAB — CULTURE, URINE COMPREHENSIVE
MICRO NUMBER:: 12644201
SPECIMEN QUALITY:: ADEQUATE

## 2021-11-17 ENCOUNTER — Telehealth: Payer: Self-pay | Admitting: Family Medicine

## 2021-11-17 NOTE — Telephone Encounter (Signed)
Pt called back after seeing her UA results in mychart.  Pt states she is having burning, and a little irritation down there. Please advise

## 2022-01-12 ENCOUNTER — Ambulatory Visit: Payer: Self-pay | Admitting: *Deleted

## 2022-01-12 NOTE — Telephone Encounter (Signed)
Congestion,SOB about a week ago.   Pt experienced sob about a week ago. Pt has not experienced sob since.Pt stated she is just very congested when she saw PCP, PCP noticed as well but told her to make an appointment if she did not feel better.      Chief Complaint: chest congestion Symptoms: cough, nasal congestion SOB at times with exertion Frequency: ongoing x 2 months off and on Pertinent Negatives: Patient denies SOB at present Disposition: [] ED /[] Urgent Care (no appt availability in office) / [x] Appointment(In office/virtual)/ []  [facility]/ [] Home Care/ [] Refused Recommended Disposition /[] [place]/ []  Follow-up with PCP Additional Notes: Pt said she was told to call back if chest congestion did not improve since last OV in Nov.  Reason for Disposition  Cough has been present for > 3 weeks  Answer Assessment - Initial Assessment Questions 1. ONSET: "When did the cough begin?"      2 months 2. SEVERITY: "How bad is the cough today?"      Not bad 3. SPUTUM: "Describe the color of your sputum" (none, dry cough; clear, white, yellow, green)     Green mucous in morning 4. HEMOPTYSIS: "Are you coughing up any blood?" If so ask: "How much?" (flecks, streaks, tablespoons, etc.)     na 5. DIFFICULTY BREATHING: "Are you having difficulty breathing?" If Yes, ask: "How bad is it?" (e.g., mild, moderate, severe)    - MILD: No SOB at rest, mild SOB with walking, speaks normally in sentences, can lie down, no retractions, pulse < 100.    - MODERATE: SOB at rest, SOB with minimal exertion and prefers to sit, cannot lie down flat, speaks in phrases, mild retractions, audible wheezing, pulse 100-120.    - SEVERE: Very SOB at rest, speaks in single words, struggling to breathe, sitting hunched forward, retractions, pulse > 120      Once in a while 6. FEVER: "Do you have a fever?" If Yes, ask: "What is your temperature, how was it measured, and when did it start?"      no 7. CARDIAC HISTORY: "Do you have any history of heart disease?" (e.g., heart attack, congestive heart failure)      na 8. LUNG HISTORY: "Do you have any history of lung disease?"  (e.g., pulmonary embolus, asthma, emphysema)     na 9. PE RISK FACTORS: "Do you have a history of blood clots?" (or: recent major surgery, recent prolonged travel, bedridden)     na 10. OTHER SYMPTOMS: "Do you have any other symptoms?" (e.g., runny nose, wheezing, chest pain)       na 11. PREGNANCY: "Is there any chance you are pregnant?" "When was your last menstrual period?"       na 12. TRAVEL: "Have you traveled out of the country in the last month?" (e.g., travel history, exposures)       na  Protocols used: Cough - Acute Productive-A-AH

## 2022-01-14 NOTE — Progress Notes (Signed)
Name: Ashlee Mccarthy   MRN: [id number]    DOB: 07-29-1951   Date:01/15/2022       Progress Note  Subjective  Chief Complaint  Chest Congestion  HPI  Wheezing: she never smoked, but her husband of 25 years smoked but not inside the house or in the car, she always in restaurants and there was second hand smoking. Her father and brother were smokers and died on lung cancer. Sister has COPD but also a smoker. Over the past 5-6 months she has noticed some wheezing at night, SOB intermittently with activity, no orthopnea, denies PND, denies lower extremity edema. Humidity does not seem to affect her symptoms . She thinks symptoms may be worse at night.   Patient Active Problem List   Diagnosis Date Noted   Hallux valgus, acquired, bilateral 04/30/2020   Benign neoplasm of ascending colon    Osteoporosis 11/25/2016   Fever blister 09/16/2016   History of hysterectomy for benign disease 09/16/2016   Menopause syndrome 09/16/2016    Past Surgical History:  Procedure Laterality Date   ABDOMINAL HYSTERECTOMY     bladder tack     CATARACT EXTRACTION W/ INTRAOCULAR LENS  IMPLANT, BILATERAL     COLONOSCOPY WITH PROPOFOL N/A 11/21/2018   Procedure: COLONOSCOPY WITH BIOPSIES;  Surgeon: Lucilla Lame, MD;  Location: [place];  Service: Endoscopy;  Laterality: N/A;   OOPHORECTOMY     POLYPECTOMY N/A 11/21/2018   Procedure: POLYPECTOMY;  Surgeon: Lucilla Lame, MD;  Location: [place];  Service: Endoscopy;  Laterality: N/A;   TUBAL LIGATION      Family History  Problem Relation Age of Onset   Cirrhosis Mother    Lung cancer Father    Cancer Sister        ovarian   Ovarian cancer Sister    Cirrhosis Sister    Cancer Sister        outside pancreas   Cancer Brother        throat and lung   Colon cancer Maternal Aunt    Breast cancer Maternal Aunt        3 mat aunts   Colon cancer Maternal Uncle    Breast cancer Other     Social History   Tobacco Use   Smoking  status: Never   Smokeless tobacco: Never  Substance Use Topics   Alcohol use: No     Current Outpatient Medications:    Cholecalciferol (VITAMIN D) 50 MCG (2000 UT) CAPS, Take 1 capsule by mouth daily., Disp: , Rfl:    conjugated estrogens (PREMARIN) vaginal cream, Place 1 Applicatorful vaginally 3 (three) times a week. Use a pea size on urethra also, Disp: 42.5 g, Rfl: 0   latanoprost (XALATAN) 0.005 % ophthalmic solution, PLACE 1 DROP INTO BOTH EYES EVERY NIGHT AT BEDTIME, Disp: , Rfl: 4   vitamin E 1000 UNIT capsule, Take 1,000 Units by mouth daily., Disp: , Rfl:   Allergies  Allergen Reactions   Penicillins Rash    I personally reviewed active problem list, medication list, allergies, family history, social history, health maintenance with the patient/caregiver today.   ROS  Ten systems reviewed and is negative except as mentioned in HPI   Objective  Vitals:   01/15/22 0910  BP: 138/66  Pulse: 93  Resp: 16  Temp: 98.6 F (37 C)  TempSrc: Oral  SpO2: 99%  Weight: 150 lb 4.8 oz (68.2 kg)  Height: 5\' 3"  (1.6 m)    Body mass index  is 26.62 kg/m.  Physical Exam  Constitutional: Patient appears well-developed and well-nourished.  No distress.  HEENT: head atraumatic, normocephalic, pupils equal and reactive to light, neck supple, Cardiovascular: Normal rate, irregular rhythm and normal heart sounds.  No murmur heard. No BLE edema. Pulmonary/Chest: Effort normal , fine crackles on right lower base and also has expiratory rhonchi intermittently . No respiratory distress. Abdominal: Soft.  There is no tenderness. Psychiatric: Patient has a normal mood and affect. behavior is normal. Judgment and thought content normal.   Recent Results (from the past 2160 hour(s))  POCT Urinalysis Dipstick     Status: Normal   Collection Time: 11/11/21  2:20 PM  Result Value Ref Range   Color, UA Pale Yellow    Clarity, UA Clear    Glucose, UA Negative Negative   Bilirubin, UA  Negative    Ketones, UA Negative    Spec Grav, UA 1.010 1.010 - 1.025   Blood, UA Negative    pH, UA 5.0 5.0 - 8.0   Protein, UA Negative Negative   Urobilinogen, UA 0.2 0.2 or 1.0 E.U./dL   Nitrite, UA Negative    Leukocytes, UA Negative Negative   Appearance Clear    Odor No   CULTURE, URINE COMPREHENSIVE     Status: Abnormal   Collection Time: 11/11/21  2:36 PM   Specimen: Urine  Result Value Ref Range   MICRO NUMBER: [id number]    SPECIMEN QUALITY: Adequate    Source OTHER (SPECIFY)    STATUS: FINAL    ISOLATE 1: Gram positive cocci isolated (A)     Comment: 1,000-9,000 CFU/ML of Gram positive cocci isolated May represent colonizers from external and internal genitalia. No further testing (including susceptibility) will be performed.   COMMENT: Gram positive cocci isolated     Comment: Gram positive cocci isolated May represent colonizers from external and internal genitalia. No further testing (including susceptibility) will be performed.    PHQ2/9: Depression screen [facility] 2/9 01/15/2022 11/11/2021 09/02/2021 08/20/2021 04/30/2020  Decreased Interest 0 0 0 0 0  Down, Depressed, Hopeless 0 0 0 0 0  PHQ - 2 Score 0 0 0 0 0  Altered sleeping 0 0 - - -  Tired, decreased energy 0 0 - - -  Change in appetite 0 0 - - -  Feeling bad or failure about yourself  0 0 - - -  Trouble concentrating 0 0 - - -  Moving slowly or fidgety/restless 0 0 - - -  Suicidal thoughts 0 0 - - -  PHQ-9 Score 0 0 - - -  Difficult doing work/chores Not difficult at all - - - -    phq 9 is negative   Fall Risk: Fall Risk  01/15/2022 11/11/2021 09/02/2021 08/20/2021 04/30/2020  Falls in the past year? 0 0 0 0 0  Number falls in past yr: 0 0 0 0 0  Injury with Fall? 0 0 0 0 0  Risk for fall due to : No Fall Risks No Fall Risks No Fall Risks No Fall Risks No Fall Risks  Follow up Falls prevention discussed Falls prevention discussed Falls prevention discussed Falls prevention discussed Falls prevention discussed       Functional Status Survey: Is the patient deaf or have difficulty hearing?: No Does the patient have difficulty seeing, even when wearing glasses/contacts?: No Does the patient have difficulty concentrating, remembering, or making decisions?: No Does the patient have difficulty walking or climbing stairs?: No Does the patient have  difficulty dressing or bathing?: No Does the patient have difficulty doing errands alone such as visiting a doctor's office or shopping?: No    Assessment & Plan  1. Irregular heart beats  - EKG 12-Lead, PVC   2. Abnormal lung sounds  - CT Chest Wo Contrast; Future We may need to refer her to pulmonologist for further evaluation   3. SOB (shortness of breath) on exertion  - CT Chest Wo Contrast; Future

## 2022-01-15 ENCOUNTER — Encounter: Payer: Self-pay | Admitting: Family Medicine

## 2022-01-15 ENCOUNTER — Ambulatory Visit (INDEPENDENT_AMBULATORY_CARE_PROVIDER_SITE_OTHER): Payer: Medicare HMO | Admitting: Family Medicine

## 2022-01-15 VITALS — BP 138/66 | HR 93 | Temp 98.6°F | Resp 16 | Ht 63.0 in | Wt 150.3 lb

## 2022-01-15 DIAGNOSIS — R0602 Shortness of breath: Secondary | ICD-10-CM | POA: Diagnosis not present

## 2022-01-15 DIAGNOSIS — I499 Cardiac arrhythmia, unspecified: Secondary | ICD-10-CM

## 2022-01-15 DIAGNOSIS — R0989 Other specified symptoms and signs involving the circulatory and respiratory systems: Secondary | ICD-10-CM

## 2022-02-04 ENCOUNTER — Other Ambulatory Visit: Payer: Self-pay

## 2022-02-04 ENCOUNTER — Ambulatory Visit
Admission: RE | Admit: 2022-02-04 | Discharge: 2022-02-04 | Disposition: A | Discharge status: Home / Self Care | Payer: Medicare HMO | Source: Ambulatory Visit | Attending: Family Medicine | Admitting: Family Medicine

## 2022-02-04 DIAGNOSIS — R0989 Other specified symptoms and signs involving the circulatory and respiratory systems: Secondary | ICD-10-CM | POA: Insufficient documentation

## 2022-02-04 DIAGNOSIS — J9811 Atelectasis: Secondary | ICD-10-CM | POA: Diagnosis not present

## 2022-02-04 DIAGNOSIS — I700 Atherosclerosis of aorta: Secondary | ICD-10-CM | POA: Diagnosis not present

## 2022-02-04 DIAGNOSIS — R0602 Shortness of breath: Secondary | ICD-10-CM | POA: Diagnosis not present

## 2022-02-04 DIAGNOSIS — R0600 Dyspnea, unspecified: Secondary | ICD-10-CM | POA: Diagnosis not present

## 2022-02-04 DIAGNOSIS — R911 Solitary pulmonary nodule: Secondary | ICD-10-CM | POA: Diagnosis not present

## 2022-02-06 ENCOUNTER — Telehealth: Payer: Self-pay | Admitting: Family Medicine

## 2022-02-06 NOTE — Telephone Encounter (Signed)
Pt is calling to request a referral to cardiology per Dr. Ancil Boozer recommendation. Pt is interested in going to Dr. Gaylene [facility]. [place], [place]. [place]

## 2022-02-09 ENCOUNTER — Other Ambulatory Visit: Payer: Self-pay

## 2022-02-09 DIAGNOSIS — E7800 Pure hypercholesterolemia, unspecified: Secondary | ICD-10-CM

## 2022-02-09 DIAGNOSIS — I499 Cardiac arrhythmia, unspecified: Secondary | ICD-10-CM

## 2022-02-09 NOTE — Telephone Encounter (Signed)
Referral placed. Patient notified. 

## 2022-02-11 ENCOUNTER — Telehealth: Payer: Self-pay

## 2022-02-11 NOTE — Telephone Encounter (Signed)
Relayed to patient.

## 2022-02-11 NOTE — Telephone Encounter (Signed)
Copied from [place] [id number]. Topic: General - Call Back - No Documentation >> Feb 11, 2022 11:43 AM Erick Blinks wrote: Reason for CRM: Pt wants a call back from the clinic to discuss findings from recent imaging. There was a nodule reported in her right lung, patient has questions  Best contact: [phone]

## 2022-02-19 DIAGNOSIS — R002 Palpitations: Secondary | ICD-10-CM | POA: Diagnosis not present

## 2022-02-19 DIAGNOSIS — I700 Atherosclerosis of aorta: Secondary | ICD-10-CM | POA: Diagnosis not present

## 2022-02-19 DIAGNOSIS — I2510 Atherosclerotic heart disease of native coronary artery without angina pectoris: Secondary | ICD-10-CM | POA: Diagnosis not present

## 2022-02-19 NOTE — Progress Notes (Signed)
Name: Ashlee Mccarthy   MRN: [id number]    DOB: 1951-11-24   Date:02/23/2022       Progress Note  Subjective  Chief Complaint  Follow Up  HPI  Wheezing: she never smoked, but her husband of 25 years smoked but not inside the house or in the car, she always in restaurants and there was second hand smoking. Her father and brother were smokers and died on lung cancer. Sister has COPD but also a smoker. Over the past 8-9 months she has noticed some wheezing at night, SOB intermittently with activity, no orthopnea, denies PND, denies lower extremity edema. She states wheezing resolved, having more nasal congestion at this time, discussed nasal sprays   Microalbuminuria: she was seen 2021  for wellness and had microalbuminuria but back to normal in 2022 , we will recheck yearly    Osteoporosis: bone density was done in 2022  , osteoporosis left femur -2.5 ,We gave her Alendronate but she did not start it. Explained risk of fractures, she states since she just started taking Pravastatin she wants to hold off on starting a new medication today    Hyperlipidemia: LDL in the low 100's, seen by cardiologist and diagnosed with atherosclerosis of aorta and also CAD on medical management only on pravastatin 20 mg    The 10-year ASCVD risk score (Arnett DK, et al., 2019) is: 6.2%   Values used to calculate the score:     Age: 71 years     Sex: Female     Is Non-Hispanic African American: No     Diabetic: No     Tobacco smoker: No     Systolic Blood Pressure: 619 mmHg     Is BP treated: No     HDL Cholesterol: 61 mg/dL     Total Cholesterol: 185 mg/dL     Patient Active Problem List   Diagnosis Date Noted   Atherosclerosis of aorta ([place]) 02/23/2022   Coronary artery disease involving native coronary artery of native heart without angina pectoris 02/23/2022   Microalbuminuria 02/23/2022   Pure hypercholesterolemia 02/23/2022   Hallux valgus, acquired, bilateral 04/30/2020   Benign neoplasm of  ascending colon    Osteoporosis 11/25/2016   Fever blister 09/16/2016   History of hysterectomy for benign disease 09/16/2016   Menopause syndrome 09/16/2016    Past Surgical History:  Procedure Laterality Date   ABDOMINAL HYSTERECTOMY     bladder tack     CATARACT EXTRACTION W/ INTRAOCULAR LENS  IMPLANT, BILATERAL     COLONOSCOPY WITH PROPOFOL N/A 11/21/2018   Procedure: COLONOSCOPY WITH BIOPSIES;  Surgeon: Lucilla Lame, MD;  Location: [facility];  Service: Endoscopy;  Laterality: N/A;   OOPHORECTOMY     POLYPECTOMY N/A 11/21/2018   Procedure: POLYPECTOMY;  Surgeon: Lucilla Lame, MD;  Location: [place];  Service: Endoscopy;  Laterality: N/A;   TUBAL LIGATION      Family History  Problem Relation Age of Onset   Cirrhosis Mother    Lung cancer Father    Cancer Sister        ovarian   Ovarian cancer Sister    Cirrhosis Sister    Cancer Sister 14       pancreatic cancer   Cancer Brother        throat and lung   Colon cancer Maternal Aunt    Breast cancer Maternal Aunt        3 mat aunts   Colon cancer Maternal Uncle  Breast cancer Other     Social History   Tobacco Use   Smoking status: Never   Smokeless tobacco: Never  Substance Use Topics   Alcohol use: No     Current Outpatient Medications:    Cholecalciferol (VITAMIN D) 50 MCG (2000 UT) CAPS, Take 1 capsule by mouth daily., Disp: , Rfl:    fluticasone (FLONASE) 50 MCG/ACT nasal spray, Place 2 sprays into both nostrils daily., Disp: 16 g, Rfl: 2   latanoprost (XALATAN) 0.005 % ophthalmic solution, PLACE 1 DROP INTO BOTH EYES EVERY NIGHT AT BEDTIME, Disp: , Rfl: 4   vitamin E 1000 UNIT capsule, Take 1,000 Units by mouth daily., Disp: , Rfl:    Zoster Vaccine Adjuvanted (SHINGRIX) injection, Inject 0.5 mLs into the muscle once for 1 dose., Disp: 0.5 mL, Rfl: 1   pravastatin (PRAVACHOL) 20 MG tablet, Take 20 mg by mouth daily., Disp: , Rfl:   Allergies  Allergen Reactions   Penicillins  Rash    I personally reviewed active problem list, medication list, allergies, family history, social history, health maintenance with the patient/caregiver today.   ROS  Constitutional: Negative for fever or weight change.  Respiratory: Negative for cough and shortness of breath.   Cardiovascular: Negative for chest pain or palpitations.  Gastrointestinal: Negative for abdominal pain, no bowel changes.  Musculoskeletal: Negative for gait problem or joint swelling.  Skin: Negative for rash.  Neurological: Negative for dizziness or headache.  No other specific complaints in a complete review of systems (except as listed in HPI above).   Objective  Vitals:   02/23/22 1357  BP: 118/64  Pulse: 82  Resp: 16  SpO2: 98%  Weight: 151 lb (68.5 kg)  Height: 5\' 4"  (1.626 m)    Body mass index is 25.92 kg/m.  Physical Exam  Constitutional: Patient appears well-developed and well-nourished.  No distress.  HEENT: head atraumatic, normocephalic, pupils equal and reactive to light, neck supple Cardiovascular: Normal rate, regular rhythm and normal heart sounds.  No murmur heard. No BLE edema. Pulmonary/Chest: Effort normal and breath sounds normal. No respiratory distress. Abdominal: Soft.  There is no tenderness. Psychiatric: Patient has a normal mood and affect. behavior is normal. Judgment and thought content normal.    PHQ2/9: Depression screen [facility] 2/9 02/23/2022 01/15/2022 11/11/2021 09/02/2021 08/20/2021  Decreased Interest 1 0 0 0 0  Down, Depressed, Hopeless 1 0 0 0 0  PHQ - 2 Score 2 0 0 0 0  Altered sleeping 0 0 0 - -  Tired, decreased energy 0 0 0 - -  Change in appetite 0 0 0 - -  Feeling bad or failure about yourself  0 0 0 - -  Trouble concentrating 0 0 0 - -  Moving slowly or fidgety/restless 0 0 0 - -  Suicidal thoughts 0 0 0 - -  PHQ-9 Score 2 0 0 - -  Difficult doing work/chores - Not difficult at all - - -    phq 9 is: grieving from death of her sister 3 weeks  ago    Fall Risk: Fall Risk  02/23/2022 01/15/2022 11/11/2021 09/02/2021 08/20/2021  Falls in the past year? 0 0 0 0 0  Number falls in past yr: 0 0 0 0 0  Injury with Fall? 0 0 0 0 0  Risk for fall due to : No Fall Risks No Fall Risks No Fall Risks No Fall Risks No Fall Risks  Follow up Falls prevention discussed Falls prevention discussed Falls  prevention discussed Falls prevention discussed Falls prevention discussed      Functional Status Survey: Is the patient deaf or have difficulty hearing?: No Does the patient have difficulty seeing, even when wearing glasses/contacts?: No Does the patient have difficulty concentrating, remembering, or making decisions?: No Does the patient have difficulty walking or climbing stairs?: No Does the patient have difficulty dressing or bathing?: No Does the patient have difficulty doing errands alone such as visiting a doctor's office or shopping?: No    Assessment & Plan  1. Pure hypercholesterolemia  On statin therapy   2. Microalbuminuria   3. Need for shingles vaccine  - Zoster Vaccine Adjuvanted [facility]) injection; Inject 0.5 mLs into the muscle once for 1 dose.  Dispense: 0.5 mL; Refill: 1  4. Osteoporosis without current pathological fracture, unspecified osteoporosis type  Refuses therapy   5. Atherosclerosis of aorta ([place])  On statin therapy   6. Coronary artery disease involving native coronary artery of native heart without angina pectoris   7. Nasal congestion  - fluticasone (FLONASE) 50 MCG/ACT nasal spray; Place 2 sprays into both nostrils daily.  Dispense: 16 g; Refill: 2

## 2022-02-23 ENCOUNTER — Encounter: Payer: Self-pay | Admitting: Family Medicine

## 2022-02-23 ENCOUNTER — Ambulatory Visit (INDEPENDENT_AMBULATORY_CARE_PROVIDER_SITE_OTHER): Payer: Medicare HMO | Admitting: Family Medicine

## 2022-02-23 VITALS — BP 118/64 | HR 82 | Resp 16 | Ht 64.0 in | Wt 151.0 lb

## 2022-02-23 DIAGNOSIS — I700 Atherosclerosis of aorta: Secondary | ICD-10-CM | POA: Diagnosis not present

## 2022-02-23 DIAGNOSIS — Z23 Encounter for immunization: Secondary | ICD-10-CM | POA: Diagnosis not present

## 2022-02-23 DIAGNOSIS — E7800 Pure hypercholesterolemia, unspecified: Secondary | ICD-10-CM

## 2022-02-23 DIAGNOSIS — R809 Proteinuria, unspecified: Secondary | ICD-10-CM | POA: Diagnosis not present

## 2022-02-23 DIAGNOSIS — I2510 Atherosclerotic heart disease of native coronary artery without angina pectoris: Secondary | ICD-10-CM | POA: Diagnosis not present

## 2022-02-23 DIAGNOSIS — R0981 Nasal congestion: Secondary | ICD-10-CM | POA: Diagnosis not present

## 2022-02-23 DIAGNOSIS — M810 Age-related osteoporosis without current pathological fracture: Secondary | ICD-10-CM

## 2022-02-23 MED ORDER — SHINGRIX 50 MCG/0.5ML IM SUSR: 0.5000 mL | Freq: Once | INTRAMUSCULAR | 1 refills | Status: AC

## 2022-02-23 MED ORDER — FLUTICASONE PROPIONATE 50 MCG/ACT NA SUSP: 2.0000 | Freq: Every day | NASAL | 2 refills | Status: DC

## 2022-06-18 DIAGNOSIS — M2012 Hallux valgus (acquired), left foot: Secondary | ICD-10-CM | POA: Diagnosis not present

## 2022-06-18 DIAGNOSIS — Q6689 Other  specified congenital deformities of feet: Secondary | ICD-10-CM | POA: Diagnosis not present

## 2022-06-18 DIAGNOSIS — M898X9 Other specified disorders of bone, unspecified site: Secondary | ICD-10-CM | POA: Diagnosis not present

## 2022-06-18 DIAGNOSIS — M2042 Other hammer toe(s) (acquired), left foot: Secondary | ICD-10-CM | POA: Diagnosis not present

## 2022-06-18 DIAGNOSIS — M2011 Hallux valgus (acquired), right foot: Secondary | ICD-10-CM | POA: Diagnosis not present

## 2022-06-18 DIAGNOSIS — M2041 Other hammer toe(s) (acquired), right foot: Secondary | ICD-10-CM | POA: Diagnosis not present

## 2022-07-06 DIAGNOSIS — H40153 Residual stage of open-angle glaucoma, bilateral: Secondary | ICD-10-CM | POA: Diagnosis not present

## 2022-07-06 DIAGNOSIS — H524 Presbyopia: Secondary | ICD-10-CM | POA: Diagnosis not present

## 2022-07-27 DIAGNOSIS — M2012 Hallux valgus (acquired), left foot: Secondary | ICD-10-CM | POA: Diagnosis not present

## 2022-07-27 DIAGNOSIS — M2011 Hallux valgus (acquired), right foot: Secondary | ICD-10-CM | POA: Diagnosis not present

## 2022-08-11 DIAGNOSIS — M2041 Other hammer toe(s) (acquired), right foot: Secondary | ICD-10-CM | POA: Diagnosis not present

## 2022-08-11 DIAGNOSIS — M2011 Hallux valgus (acquired), right foot: Secondary | ICD-10-CM | POA: Diagnosis not present

## 2022-08-24 ENCOUNTER — Encounter: Payer: Medicare HMO | Admitting: Family Medicine

## 2022-09-02 ENCOUNTER — Other Ambulatory Visit: Payer: Self-pay | Admitting: Family Medicine

## 2022-09-02 DIAGNOSIS — Z1231 Encounter for screening mammogram for malignant neoplasm of breast: Secondary | ICD-10-CM

## 2022-09-03 ENCOUNTER — Ambulatory Visit: Payer: Medicare HMO

## 2022-09-08 ENCOUNTER — Other Ambulatory Visit: Payer: Self-pay | Admitting: Podiatry

## 2022-09-08 DIAGNOSIS — M898X9 Other specified disorders of bone, unspecified site: Secondary | ICD-10-CM | POA: Diagnosis not present

## 2022-09-08 DIAGNOSIS — M2011 Hallux valgus (acquired), right foot: Secondary | ICD-10-CM | POA: Diagnosis not present

## 2022-09-10 ENCOUNTER — Encounter: Payer: Self-pay | Admitting: Podiatry

## 2022-09-15 NOTE — Discharge Instructions (Signed)
[facility]  POST OPERATIVE INSTRUCTIONS FOR DR. FOWLER AND [facility]   Take your medication as prescribed.  Pain medication should be taken only as needed.  Keep the dressing clean, dry and intact.  Keep your foot elevated above the heart level for the first 48 hours.  Walking to the bathroom and brief periods of walking are acceptable, unless we have instructed you to be non-weight bearing.  Always wear your post-op shoe when walking.  Always use your crutches if you are to be non-weight bearing.  Do not take a shower. Baths are permissible as long as the foot is kept out of the water.   Every hour you are awake:  Bend your knee 15 times. Flex foot 15 times Massage calf 15 times  Call [facility] ([phone]) if any of the following problems occur: You develop a temperature or fever. The bandage becomes saturated with blood. Medication does not stop your pain. Injury of the foot occurs. Any symptoms of infection including redness, odor, or red streaks running from wound. 

## 2022-09-16 ENCOUNTER — Ambulatory Visit (AMBULATORY_SURGERY_CENTER): Payer: Medicare HMO | Admitting: Anesthesiology

## 2022-09-16 ENCOUNTER — Encounter
Admission: RE | Disposition: A | Discharge status: Home / Self Care | Payer: Self-pay | Source: Home / Self Care | Attending: Podiatry

## 2022-09-16 ENCOUNTER — Ambulatory Visit: Payer: Medicare HMO | Admitting: Anesthesiology

## 2022-09-16 ENCOUNTER — Other Ambulatory Visit: Payer: Self-pay

## 2022-09-16 ENCOUNTER — Ambulatory Visit: Payer: Self-pay

## 2022-09-16 ENCOUNTER — Ambulatory Visit
Admission: RE | Admit: 2022-09-16 | Discharge: 2022-09-16 | Disposition: A | Discharge status: Home / Self Care | Payer: Medicare HMO | Attending: Podiatry | Admitting: Podiatry

## 2022-09-16 ENCOUNTER — Encounter: Payer: Self-pay | Admitting: Podiatry

## 2022-09-16 DIAGNOSIS — M898X7 Other specified disorders of bone, ankle and foot: Secondary | ICD-10-CM | POA: Diagnosis not present

## 2022-09-16 DIAGNOSIS — M2011 Hallux valgus (acquired), right foot: Secondary | ICD-10-CM | POA: Diagnosis not present

## 2022-09-16 DIAGNOSIS — I2510 Atherosclerotic heart disease of native coronary artery without angina pectoris: Secondary | ICD-10-CM | POA: Diagnosis not present

## 2022-09-16 DIAGNOSIS — M899 Disorder of bone, unspecified: Secondary | ICD-10-CM | POA: Diagnosis not present

## 2022-09-16 HISTORY — PX: EXCISION PARTIAL PHALANX: SHX6617

## 2022-09-16 HISTORY — PX: BUNIONECTOMY: SHX129

## 2022-09-16 SURGERY — BUNIONECTOMY
Anesthesia: General | Site: Toe | Laterality: Right

## 2022-09-16 MED ORDER — PROMETHAZINE HCL 25 MG/ML IJ SOLN: 6.2500 mg | INTRAMUSCULAR | Status: DC | PRN

## 2022-09-16 MED ORDER — ONDANSETRON HCL 4 MG/2ML IJ SOLN
INTRAMUSCULAR | Status: DC | PRN
  Administered 2022-09-16: 4 mg via INTRAVENOUS

## 2022-09-16 MED ORDER — BUPIVACAINE LIPOSOME 1.3 % IJ SUSP
INTRAMUSCULAR | Status: DC | PRN
  Administered 2022-09-16 (×2): 10 mL

## 2022-09-16 MED ORDER — PROPOFOL 10 MG/ML IV BOLUS
INTRAVENOUS | Status: DC | PRN
  Administered 2022-09-16: 100 mg via INTRAVENOUS
  Administered 2022-09-16: 100 ug/kg/min via INTRAVENOUS

## 2022-09-16 MED ORDER — PROMETHAZINE HCL 12.5 MG PO TABS: 12.5000 mg | ORAL_TABLET | Freq: Four times a day (QID) | ORAL | 0 refills | Status: DC | PRN

## 2022-09-16 MED ORDER — SODIUM CHLORIDE 0.9 % IR SOLN
Status: DC | PRN
  Administered 2022-09-16: 1

## 2022-09-16 MED ORDER — HYDROMORPHONE HCL 1 MG/ML IJ SOLN
0.2500 mg | INTRAMUSCULAR | Status: DC | PRN
  Administered 2022-09-16 (×2): 0.25 mg via INTRAVENOUS

## 2022-09-16 MED ORDER — METOCLOPRAMIDE HCL 5 MG/ML IJ SOLN: 5.0000 mg | Freq: Three times a day (TID) | INTRAMUSCULAR | Status: DC | PRN

## 2022-09-16 MED ORDER — BUPIVACAINE HCL (PF) 0.25 % IJ SOLN
INTRAMUSCULAR | Status: DC | PRN
  Administered 2022-09-16: 10 mL

## 2022-09-16 MED ORDER — OXYCODONE HCL 5 MG PO TABS
5.0000 mg | ORAL_TABLET | Freq: Once | ORAL | Status: AC | PRN
  Administered 2022-09-16: 5 mg via ORAL

## 2022-09-16 MED ORDER — LACTATED RINGERS IV SOLN: INTRAVENOUS | Status: DC

## 2022-09-16 MED ORDER — ONDANSETRON HCL 4 MG/2ML IJ SOLN: 4.0000 mg | Freq: Four times a day (QID) | INTRAMUSCULAR | Status: DC | PRN

## 2022-09-16 MED ORDER — OXYCODONE HCL 5 MG/5ML PO SOLN: 5.0000 mg | Freq: Once | ORAL | Status: AC | PRN

## 2022-09-16 MED ORDER — LACTATED RINGERS IV SOLN: INTRAVENOUS | Status: DC | PRN

## 2022-09-16 MED ORDER — OXYCODONE-ACETAMINOPHEN 5-325 MG PO TABS: 1.0000 | ORAL_TABLET | Freq: Four times a day (QID) | ORAL | 0 refills | Status: DC | PRN

## 2022-09-16 MED ORDER — ONDANSETRON HCL 4 MG PO TABS: 4.0000 mg | ORAL_TABLET | Freq: Four times a day (QID) | ORAL | Status: DC | PRN

## 2022-09-16 MED ORDER — DIPHENHYDRAMINE HCL 50 MG/ML IJ SOLN
INTRAMUSCULAR | Status: DC | PRN
  Administered 2022-09-16: 12.5 mg via INTRAVENOUS

## 2022-09-16 MED ORDER — METOCLOPRAMIDE HCL 5 MG PO TABS: 5.0000 mg | ORAL_TABLET | Freq: Three times a day (TID) | ORAL | Status: DC | PRN

## 2022-09-16 MED ORDER — MIDAZOLAM HCL 5 MG/5ML IJ SOLN
INTRAMUSCULAR | Status: DC | PRN
  Administered 2022-09-16: 2 mg via INTRAVENOUS

## 2022-09-16 MED ORDER — DEXAMETHASONE SODIUM PHOSPHATE 4 MG/ML IJ SOLN
INTRAMUSCULAR | Status: DC | PRN
  Administered 2022-09-16: 8 mg via INTRAVENOUS

## 2022-09-16 MED ORDER — FENTANYL CITRATE (PF) 100 MCG/2ML IJ SOLN
INTRAMUSCULAR | Status: DC | PRN
  Administered 2022-09-16 (×2): 50 ug via INTRAVENOUS

## 2022-09-16 MED ORDER — CEFAZOLIN SODIUM-DEXTROSE 2-4 GM/100ML-% IV SOLN
2.0000 g | INTRAVENOUS | Status: AC
  Administered 2022-09-16: 2 g via INTRAVENOUS

## 2022-09-16 MED ORDER — EPHEDRINE SULFATE (PRESSORS) 50 MG/ML IJ SOLN
INTRAMUSCULAR | Status: DC | PRN
  Administered 2022-09-16: 10 mg via INTRAVENOUS
  Administered 2022-09-16: 5 mg via INTRAVENOUS

## 2022-09-16 SURGICAL SUPPLY — 59 items
APL SKNCLS STERI-STRIP NONHPOA (GAUZE/BANDAGES/DRESSINGS) ×2
BENZOIN TINCTURE PRP APPL 2/3 (GAUZE/BANDAGES/DRESSINGS) ×2 IMPLANT
BLADE MED AGGRESSIVE (BLADE) IMPLANT
BLADE OSC/SAGITTAL MD 5.5X18 (BLADE) IMPLANT
BLADE SAW LAPIPLASTY 40X11 (BLADE) IMPLANT
BLADE SURG 15 STRL LF DISP TIS (BLADE) IMPLANT
BLADE SURG 15 STRL SS (BLADE)
BNDG CMPR 5X4 CHSV STRCH STRL (GAUZE/BANDAGES/DRESSINGS) ×2
BNDG CMPR 75X41 PLY HI ABS (GAUZE/BANDAGES/DRESSINGS) ×2
BNDG COHESIVE 4X5 TAN STRL LF (GAUZE/BANDAGES/DRESSINGS) IMPLANT
BNDG ELASTIC 4X5.8 VLCR STR LF (GAUZE/BANDAGES/DRESSINGS) ×2 IMPLANT
BNDG ESMARK 4X12 TAN STRL LF (GAUZE/BANDAGES/DRESSINGS) ×2 IMPLANT
BNDG GAUZE DERMACEA FLUFF 4 (GAUZE/BANDAGES/DRESSINGS) IMPLANT
BNDG GZE DERMACEA 4 6PLY (GAUZE/BANDAGES/DRESSINGS) ×2
BNDG STRETCH 4X75 STRL LF (GAUZE/BANDAGES/DRESSINGS) ×2 IMPLANT
BOOT STEPPER DURA MED (SOFTGOODS) IMPLANT
BUR FISSURE CARBIDE (BURR) IMPLANT
CANISTER SUCT 1200ML W/VALVE (MISCELLANEOUS) ×2 IMPLANT
COVER LIGHT HANDLE UNIVERSAL (MISCELLANEOUS) ×4 IMPLANT
CUFF TOURN SGL QUICK 18X4 (TOURNIQUET CUFF) IMPLANT
DRAPE FLUOR MINI C-ARM 54X84 (DRAPES) ×2 IMPLANT
DURAPREP 26ML APPLICATOR (WOUND CARE) ×2 IMPLANT
ELECT REM PT RETURN 9FT ADLT (ELECTROSURGICAL) ×2
ELECTRODE REM PT RTRN 9FT ADLT (ELECTROSURGICAL) ×2 IMPLANT
GAUZE SPONGE 4X4 12PLY STRL (GAUZE/BANDAGES/DRESSINGS) ×2 IMPLANT
GAUZE XEROFORM 1X8 LF (GAUZE/BANDAGES/DRESSINGS) ×2 IMPLANT
GLOVE SRG 8 PF TXTR STRL LF DI (GLOVE) ×4 IMPLANT
GLOVE SURG ENC MOIS LTX SZ7.5 (GLOVE) ×4 IMPLANT
GLOVE SURG UNDER POLY LF SZ8 (GLOVE) ×4
GOWN STRL REUS W/ TWL LRG LVL3 (GOWN DISPOSABLE) ×4 IMPLANT
GOWN STRL REUS W/TWL LRG LVL3 (GOWN DISPOSABLE) ×4
K-WIRE DBL END TROCAR 6X.045 (WIRE)
K-WIRE DBL END TROCAR 6X.062 (WIRE)
KIT TURNOVER KIT A (KITS) ×2 IMPLANT
KWIRE DBL END TROCAR 6X.045 (WIRE) IMPLANT
KWIRE DBL END TROCAR 6X.062 (WIRE) IMPLANT
LAPIPLASTY SYS 4A (Orthopedic Implant) ×2 IMPLANT
NS IRRIG 500ML POUR BTL (IV SOLUTION) ×2 IMPLANT
PACK EXTREMITY ARMC (MISCELLANEOUS) ×2 IMPLANT
RASP SM TEAR CROSS CUT (RASP) IMPLANT
SCREW 2.7 HIGH PITCH LOCKING (Screw) IMPLANT
SCREW HIGH PITCH LOCK 2.7 (Screw) IMPLANT
STOCKINETTE IMPERVIOUS LG (DRAPES) ×2 IMPLANT
STRIP CLOSURE SKIN 1/4X4 (GAUZE/BANDAGES/DRESSINGS) ×2 IMPLANT
SUCTION FRAZIER HANDLE 10FR (MISCELLANEOUS) ×2
SUCTION TUBE FRAZIER 10FR DISP (MISCELLANEOUS) IMPLANT
SUT ETHILON 3-0 (SUTURE) IMPLANT
SUT MNCRL 4-0 (SUTURE) ×2
SUT MNCRL 4-0 27XMFL (SUTURE) ×2
SUT MNCRL+ 5-0 UNDYED PC-3 (SUTURE) IMPLANT
SUT MONOCRYL 5-0 (SUTURE)
SUT VIC AB 2-0 SH 27 (SUTURE)
SUT VIC AB 2-0 SH 27XBRD (SUTURE) IMPLANT
SUT VIC AB 3-0 SH 27 (SUTURE) ×2
SUT VIC AB 3-0 SH 27X BRD (SUTURE) IMPLANT
SUT VIC AB 4-0 SH 27 (SUTURE) ×2
SUT VIC AB 4-0 SH 27XANBCTRL (SUTURE) IMPLANT
SUTURE MNCRL 4-0 27XMF (SUTURE) IMPLANT
SYSTEM LAPIPLASTY 4A (Orthopedic Implant) IMPLANT

## 2022-09-16 NOTE — H&P (Signed)
HISTORY AND PHYSICAL INTERVAL NOTE:  09/16/2022  11:34 AM  Ashlee Mccarthy  has presented today for surgery, with the diagnosis of M20.11 - Hallux valgus of right foot M20.24 - Hammer tod of right foot.  The various methods of treatment have been discussed with the patient.  No guarantees were given.  After consideration of risks, benefits and other options for treatment, the patient has consented to surgery.  I have reviewed the patients' chart and labs.     A history and physical examination was performed in my office.  The patient was reexamined.  There have been no changes to this history and physical examination.  Samara Deist A

## 2022-09-16 NOTE — Op Note (Addendum)
Operative note   Surgeon:Flavio Lindroth Lawyer: None    Preop diagnosis: 1.Hallux valgus deformity right foot 2.  Exostosis PIPJ right second toe 3.  Exostosis distal phalanx right great toe    Postop diagnosis: Same    Procedure: 1.  Lapidus hallux valgus correction right foot 2.  Exostectomy PIPJ right second toe 3.  Exostectomy distal phalanx right great toe 4.  Intraoperative fluoroscopy use right foot    EBL: Minimal    Anesthesia:local and general.  Local consisted of a total of 20 cc of Exparel long-acting anesthetic and 10 cc of 0.25% bupivacaine    Hemostasis: Mid calf tourniquet inflated to 200 mmHg for approximately 75 minutes    Specimen: None    Complications: None    Operative indications:Ashlee Mccarthy is an 71 y.o. that presents today for surgical intervention.  The risks/benefits/alternatives/complications have been discussed and consent has been given.    Procedure:  Patient was brought into the OR and placed on the operating table in thesupine position. After anesthesia was obtained theright lower extremity was prepped and draped in usual sterile fashion.  Attention was directed to the dorsal aspect of the foot where a dorsal incision was made at the first met cuneiforms joint.  Sharp and blunt dissection was carried down to the periosteum.  Subperiosteal dissection was then undertaken.  This exposed the first met cuneiform joint.  This was then freed and loosened.  A small fulcrum was placed between the base of the first metatarsal and second metatarsal.  Next the joint positioner was placed on the medial aspect of the metatarsal.  A small stab incision was made at the second metatarsal.  Compression was placed for the positioner.  Good realignment of the first intermetatarsal angle was noted at this time.  Attention was then directed to the dorsomedial first MTPJ where an incision was performed.  Sharp and blunt dissection was carried down to the capsule.   The intermetatarsal space was then entered.  The conjoined tendon of the abductor was then freed from the base of the proximal phalanx.  Attention was to redirected to the first met cuneiform joint.  At this time the osteotomy cut guide was placed into the joint region.  2 vertical cuts were then made.  The cartilage material was removed from the first met cuneiform joint and the joint was then prepped with a 2.0 mm drill bit.  The joint compressor was then placed.  Good compression and realignment was noted.  Next the medial and dorsal locking plates were then placed from the Hamilton set.  Realignment and stability was noted in all planes.  Attention was redirected to the dorsomedial first MTPJ.  A small T capsulotomy was performed.  The dorsomedial eminence was noted and transected and smoothed with a power rasp.  A small capsulorrhaphy was performed.  Closure was then performed after all areas were irrigated.  3-0 Vicryl for the capsular tissue.  4-0 Vicryl in subcutaneous tissue and a 4-0 Monocryl for the skin.   Attention was then directed to the PIPJ of the second toe where a prominent hyperkeratotic lesion was noted.  This was just medial.  A small 1 cm incision was placed dorsally.  Sharp and blunt dissection carried down to the PIPJ.  This exposed the distal medial proximal phalanx.  With a small sidecutting bur I was able to remove a small prominence along this area.  This was then flushed with copious amounts of irrigation.  Closure was performed with a 3-0 nylon.  Attention was then directed to the distal lateral aspect of the distal phalanx at the lateral condyle of the great toe.  A small 1 cm incision was taken down to the condyle itself.  Subperiosteal dissection was performed.  Next with a small sidecutting bur a small prominence was removed from the lateral condyle of the distal phalanx.  This was then flushed with copious amounts of irrigation.  Closure of this wound was performed with  a 3-0 nylon.  Intraoperative fluoroscopy was used throughout the procedure to confirm bone cuts and hardware placement.  Further local anesthesia was performed at the end of the case.    Patient tolerated the procedure and anesthesia well.  Was transported from the OR to the PACU with all vital signs stable and vascular status intact. To be discharged per routine protocol.  Will follow up in approximately 1 week in the outpatient clinic.

## 2022-09-16 NOTE — Transfer of Care (Signed)
Immediate Anesthesia Transfer of Care Note  Patient: Ashlee Mccarthy  Procedure(s) Performed: LAPIDUS TYPE (Right: Foot) EXCISION PARTIAL PHALANX - SECOND (Right: Toe)  Patient Location: PACU  Anesthesia Type: General  Level of Consciousness: awake, alert  and patient cooperative  Airway and Oxygen Therapy: Patient Spontanous Breathing and Patient connected to supplemental oxygen  Post-op Assessment: Post-op Vital signs reviewed, Patient's Cardiovascular Status Stable, Respiratory Function Stable, Patent Airway and No signs of Nausea or vomiting  Post-op Vital Signs: Reviewed and stable  Complications: No notable events documented.

## 2022-09-16 NOTE — Anesthesia Procedure Notes (Signed)
Procedure Name: LMA Insertion Date/Time: 09/16/2022 12:10 PM  Performed by: Tobie Poet, CRNAPre-anesthesia Checklist: Patient identified, Emergency Drugs available, Suction available and Patient being monitored Patient Re-evaluated:Patient Re-evaluated prior to induction Oxygen Delivery Method: Circle system utilized Preoxygenation: Pre-oxygenation with 100% oxygen Induction Type: IV induction Ventilation: Mask ventilation without difficulty LMA: LMA inserted LMA Size: 4.0 Number of attempts: 1 Placement Confirmation: ETT inserted through vocal cords under direct vision, positive ETCO2 and breath sounds checked- equal and bilateral Tube secured with: Tape Dental Injury: Teeth and Oropharynx as per pre-operative assessment

## 2022-09-16 NOTE — Anesthesia Preprocedure Evaluation (Signed)
Anesthesia Evaluation  Patient identified by MRN, date of birth, ID band Patient awake    Reviewed: Allergy & Precautions, H&P , NPO status , Patient's Chart, lab work & pertinent test results  History of Anesthesia Complications (+) PONV and history of anesthetic complications  Airway Mallampati: II  TM Distance: >3 FB Neck ROM: full    Dental no notable dental hx.    Pulmonary    Pulmonary exam normal breath sounds clear to auscultation       Cardiovascular + CAD  Normal cardiovascular exam Rhythm:regular Rate:Normal     Neuro/Psych    GI/Hepatic   Endo/Other    Renal/GU      Musculoskeletal   Abdominal   Peds  Hematology   Anesthesia Other Findings   Reproductive/Obstetrics                             Anesthesia Physical  Anesthesia Plan  ASA: 2  Anesthesia Plan: General   Post-op Pain Management:    Induction: Intravenous  PONV Risk Score and Plan: 4 or greater and Treatment may vary due to age or medical condition, Dexamethasone, Ondansetron and Midazolam  Airway Management Planned: Natural Airway  Additional Equipment:   Intra-op Plan:   Post-operative Plan:   Informed Consent: I have reviewed the patients History and Physical, chart, labs and discussed the procedure including the risks, benefits and alternatives for the proposed anesthesia with the patient or authorized representative who has indicated his/her understanding and acceptance.       Plan Discussed with: CRNA  Anesthesia Plan Comments:         Anesthesia Quick Evaluation

## 2022-09-16 NOTE — Anesthesia Postprocedure Evaluation (Signed)
Anesthesia Post Note  Patient: Ashlee Mccarthy  Procedure(s) Performed: LAPIDUS TYPE (Right: Foot) EXCISION PARTIAL PHALANX - SECOND (Right: Toe)     Patient location during evaluation: PACU Anesthesia Type: General Level of consciousness: awake and alert Pain management: pain level controlled Vital Signs Assessment: post-procedure vital signs reviewed and stable Respiratory status: spontaneous breathing, nonlabored ventilation, respiratory function stable and patient connected to nasal cannula oxygen Cardiovascular status: blood pressure returned to baseline and stable Postop Assessment: no apparent nausea or vomiting Anesthetic complications: no   No notable events documented.  April Manson

## 2022-09-18 ENCOUNTER — Encounter: Payer: Self-pay | Admitting: Podiatry

## 2022-09-22 DIAGNOSIS — M2011 Hallux valgus (acquired), right foot: Secondary | ICD-10-CM | POA: Diagnosis not present

## 2022-10-13 DIAGNOSIS — M2011 Hallux valgus (acquired), right foot: Secondary | ICD-10-CM | POA: Diagnosis not present

## 2022-10-19 ENCOUNTER — Ambulatory Visit
Admission: RE | Admit: 2022-10-19 | Discharge: 2022-10-19 | Disposition: A | Discharge status: Home / Self Care | Payer: Medicare HMO | Source: Ambulatory Visit | Attending: Family Medicine | Admitting: Family Medicine

## 2022-10-19 DIAGNOSIS — Z1231 Encounter for screening mammogram for malignant neoplasm of breast: Secondary | ICD-10-CM | POA: Insufficient documentation

## 2022-10-21 ENCOUNTER — Other Ambulatory Visit: Payer: Self-pay | Admitting: Family Medicine

## 2022-10-21 DIAGNOSIS — N6489 Other specified disorders of breast: Secondary | ICD-10-CM

## 2022-10-21 DIAGNOSIS — R928 Other abnormal and inconclusive findings on diagnostic imaging of breast: Secondary | ICD-10-CM

## 2022-10-22 ENCOUNTER — Telehealth: Payer: Self-pay | Admitting: Family Medicine

## 2022-10-22 NOTE — Telephone Encounter (Signed)
Copied from [place] [id number]. Topic: General - Other >> Oct 22, 2022 10:41 AM [place] B wrote: Reason for CRM: Patient called in states had mammogram done and was told she needs to do a fu on on it,not seeing notes about this Please call back >> Oct 22, 2022 10:48 AM Erskine Squibb wrote: Patient called back and [facility] called her and wants her to come back in to have an Ultrasound. Disregard previous message

## 2022-10-26 ENCOUNTER — Ambulatory Visit
Admission: RE | Admit: 2022-10-26 | Discharge: 2022-10-26 | Disposition: A | Discharge status: Home / Self Care | Payer: Medicare HMO | Source: Ambulatory Visit | Attending: Family Medicine | Admitting: Family Medicine

## 2022-10-26 DIAGNOSIS — R92321 Mammographic fibroglandular density, right breast: Secondary | ICD-10-CM | POA: Diagnosis not present

## 2022-10-26 DIAGNOSIS — R928 Other abnormal and inconclusive findings on diagnostic imaging of breast: Secondary | ICD-10-CM | POA: Diagnosis not present

## 2022-10-26 DIAGNOSIS — N6489 Other specified disorders of breast: Secondary | ICD-10-CM | POA: Insufficient documentation

## 2022-10-27 DIAGNOSIS — M2011 Hallux valgus (acquired), right foot: Secondary | ICD-10-CM | POA: Diagnosis not present

## 2022-11-09 DIAGNOSIS — H40153 Residual stage of open-angle glaucoma, bilateral: Secondary | ICD-10-CM | POA: Diagnosis not present

## 2022-11-11 ENCOUNTER — Ambulatory Visit: Payer: Medicare HMO | Admitting: Family Medicine

## 2022-11-12 NOTE — Progress Notes (Signed)
Name: Ashlee Mccarthy   MRN: [id number]    DOB: 21-Jan-1951   Date:11/13/2022       Progress Note  Subjective  Chief Complaint  Consult  HPI  Atherosclerosis of aorta/dyslipidemia and abnormal left fundal eye exam: Optometrist from Dr. Gloriann Loan called earlier this week stating patient went for eye exam and they found an abnormal vertical cut in the mid line of left eye , not seen previously , he suggested brain imaging to rule out a stroke. Patient has other risk factors for strokes and discussed it with her today   Microalbuminuria: we will recheck level  SOB and CAD :she was seen by Dr. Erin Fulling and was given statin therapy in Feb bu stopped taking it due to muscle pain, she states since she has not been as active no longer having sob. Discussed importance of trying another statin and taking aspirin 81 mg but she refuses statin at this time   Osteoporosis: not on medication, only on vitamin D and high calcium diet , discussed risk of fractures but not interested on medications at this time    Patient Active Problem List   Diagnosis Date Noted   Atherosclerosis of aorta ([place]) 02/23/2022   Coronary artery disease involving native coronary artery of native heart without angina pectoris 02/23/2022   Microalbuminuria 02/23/2022   Pure hypercholesterolemia 02/23/2022   Hallux valgus, acquired, bilateral 04/30/2020   Benign neoplasm of ascending colon    Osteoporosis 11/25/2016   Fever blister 09/16/2016   History of hysterectomy for benign disease 09/16/2016   Menopause syndrome 09/16/2016    Past Surgical History:  Procedure Laterality Date   ABDOMINAL HYSTERECTOMY     bladder tack     BUNIONECTOMY Right 09/16/2022   Procedure: LAPIDUS TYPE;  Surgeon: Samara Deist, DPM;  Location: [facility];  Service: Podiatry;  Laterality: Right;   CATARACT EXTRACTION W/ INTRAOCULAR LENS  IMPLANT, BILATERAL     COLONOSCOPY WITH PROPOFOL N/A 11/21/2018   Procedure: COLONOSCOPY WITH  BIOPSIES;  Surgeon: Lucilla Lame, MD;  Location: [place];  Service: Endoscopy;  Laterality: N/A;   EXCISION PARTIAL PHALANX Right 09/16/2022   Procedure: EXCISION PARTIAL PHALANX - SECOND;  Surgeon: Samara Deist, DPM;  Location: [facility];  Service: Podiatry;  Laterality: Right;   OOPHORECTOMY     POLYPECTOMY N/A 11/21/2018   Procedure: POLYPECTOMY;  Surgeon: Lucilla Lame, MD;  Location: [place];  Service: Endoscopy;  Laterality: N/A;   TUBAL LIGATION      Family History  Problem Relation Age of Onset   Cirrhosis Mother    Lung cancer Father    Cancer Sister        ovarian   Ovarian cancer Sister    Cirrhosis Sister    Cancer Sister 8       pancreatic cancer   Colon cancer Maternal Aunt    Breast cancer Maternal Aunt        3 mat aunts   Breast cancer Maternal Aunt    Colon cancer Maternal Uncle    Cancer Brother        throat and lung   Breast cancer Other     Social History   Tobacco Use   Smoking status: Never   Smokeless tobacco: Never  Substance Use Topics   Alcohol use: No     Current Outpatient Medications:    Cholecalciferol (VITAMIN D) 50 MCG (2000 UT) CAPS, Take 1 capsule by mouth daily., Disp: , Rfl:  latanoprost (XALATAN) 0.005 % ophthalmic solution, PLACE 1 DROP INTO BOTH EYES EVERY NIGHT AT BEDTIME, Disp: , Rfl: 4   vitamin E 1000 UNIT capsule, Take 1,000 Units by mouth daily., Disp: , Rfl:   Allergies  Allergen Reactions   Penicillins Rash    I personally reviewed active problem list, medication list, allergies, family history, social history, health maintenance with the patient/caregiver today.   ROS  Constitutional: Negative for fever or weight change.  Respiratory: Negative for cough and shortness of breath.   Cardiovascular: Negative for chest pain or palpitations.  Gastrointestinal: Negative for abdominal pain, no bowel changes.  Musculoskeletal: Negative for gait problem or joint swelling.  Skin:  Negative for rash.  Neurological: Negative for dizziness or headache.  No other specific complaints in a complete review of systems (except as listed in HPI above).   Objective  Vitals:   11/13/22 1024  BP: 116/64  Pulse: 79  Resp: 14  Temp: 97.7 F (36.5 C)  TempSrc: Oral  SpO2: 98%  Weight: 149 lb 11.2 oz (67.9 kg)  Height: '5\' 4"'$  (1.626 m)    Body mass index is 25.7 kg/m.  Physical Exam  Constitutional: Patient appears well-developed and well-nourished.  No distress.  HEENT: head atraumatic, normocephalic, pupils equal and reactive to light, neck supple, throat within normal limit Cardiovascular: Normal rate, regular rhythm and normal heart sounds.  No murmur heard. No BLE edema. Pulmonary/Chest: Effort normal and breath sounds normal. No respiratory distress. Abdominal: Soft.  There is no tenderness. Psychiatric: Patient has a normal mood and affect. behavior is normal. Judgment and thought content normal.   PHQ2/9:    11/13/2022   10:27 AM 02/23/2022    1:56 PM 01/15/2022    9:10 AM 11/11/2021    2:32 PM 09/02/2021   11:48 AM  Depression screen PHQ 2/9  Decreased Interest 0 1 0 0 0  Down, Depressed, Hopeless 0 1 0 0 0  PHQ - 2 Score 0 2 0 0 0  Altered sleeping 0 0 0 0   Tired, decreased energy 0 0 0 0   Change in appetite 0 0 0 0   Feeling bad or failure about yourself  0 0 0 0   Trouble concentrating 0 0 0 0   Moving slowly or fidgety/restless 0 0 0 0   Suicidal thoughts 0 0 0 0   PHQ-9 Score 0 2 0 0   Difficult doing work/chores   Not difficult at all      phq 9 is negative   Fall Risk:    11/13/2022   10:27 AM 02/23/2022    1:56 PM 01/15/2022    9:10 AM 11/11/2021    2:32 PM 09/02/2021   11:50 AM  Fall Risk   Falls in the past year? 0 0 0 0 0  Number falls in past yr:  0 0 0 0  Injury with Fall?  0 0 0 0  Risk for fall due to : No Fall Risks No Fall Risks No Fall Risks No Fall Risks No Fall Risks  Follow up Falls prevention discussed;Education  provided;Falls evaluation completed Falls prevention discussed Falls prevention discussed Falls prevention discussed Falls prevention discussed      Functional Status Survey: Is the patient deaf or have difficulty hearing?: No Does the patient have difficulty seeing, even when wearing glasses/contacts?: No Does the patient have difficulty concentrating, remembering, or making decisions?: No Does the patient have difficulty walking or climbing stairs?: No Does the  patient have difficulty dressing or bathing?: No Does the patient have difficulty doing errands alone such as visiting a doctor's office or shopping?: No    Assessment & Plan  1. Eye exam abnormal  - MR Angiogram Head Wo Contrast; Future  2. Atherosclerosis of aorta (HCC)  - Lipid panel - MR Angiogram Head Wo Contrast; Future  3. Coronary artery disease involving native coronary artery of native heart without angina pectoris  - Lipid panel  4. Microalbuminuria  - Microalbumin / creatinine urine ratio  5. Osteoporosis without current pathological fracture, unspecified osteoporosis type  Refuses medication   6. Long-term use of high-risk medication  - COMPLETE METABOLIC PANEL WITH GFR  7. Statin myopathy

## 2022-11-13 ENCOUNTER — Encounter: Payer: Self-pay | Admitting: Family Medicine

## 2022-11-13 ENCOUNTER — Ambulatory Visit (INDEPENDENT_AMBULATORY_CARE_PROVIDER_SITE_OTHER): Payer: Medicare HMO | Admitting: Family Medicine

## 2022-11-13 VITALS — BP 116/64 | HR 79 | Temp 97.7°F | Resp 14 | Ht 64.0 in | Wt 149.7 lb

## 2022-11-13 DIAGNOSIS — M810 Age-related osteoporosis without current pathological fracture: Secondary | ICD-10-CM | POA: Diagnosis not present

## 2022-11-13 DIAGNOSIS — I700 Atherosclerosis of aorta: Secondary | ICD-10-CM | POA: Diagnosis not present

## 2022-11-13 DIAGNOSIS — G720 Drug-induced myopathy: Secondary | ICD-10-CM | POA: Diagnosis not present

## 2022-11-13 DIAGNOSIS — R809 Proteinuria, unspecified: Secondary | ICD-10-CM

## 2022-11-13 DIAGNOSIS — H579 Unspecified disorder of eye and adnexa: Secondary | ICD-10-CM | POA: Diagnosis not present

## 2022-11-13 DIAGNOSIS — T466X5A Adverse effect of antihyperlipidemic and antiarteriosclerotic drugs, initial encounter: Secondary | ICD-10-CM

## 2022-11-13 DIAGNOSIS — I2510 Atherosclerotic heart disease of native coronary artery without angina pectoris: Secondary | ICD-10-CM

## 2022-11-13 DIAGNOSIS — Z79899 Other long term (current) drug therapy: Secondary | ICD-10-CM | POA: Diagnosis not present

## 2022-11-14 LAB — COMPLETE METABOLIC PANEL WITH GFR
AG Ratio: 1.8 (calc) (ref 1.0–2.5)
ALT: 8 U/L (ref 6–29)
AST: 19 U/L (ref 10–35)
Albumin: 4.3 g/dL (ref 3.6–5.1)
Alkaline phosphatase (APISO): 87 U/L (ref 37–153)
BUN: 12 mg/dL (ref 7–25)
CO2: 24 mmol/L (ref 20–32)
Calcium: 9.5 mg/dL (ref 8.6–10.4)
Chloride: 106 mmol/L (ref 98–110)
Creat: 0.59 mg/dL (ref 0.50–1.05)
Globulin: 2.4 g/dL (calc) (ref 1.9–3.7)
Glucose, Bld: 88 mg/dL (ref 65–99)
Potassium: 4.1 mmol/L (ref 3.5–5.3)
Sodium: 139 mmol/L (ref 135–146)
Total Bilirubin: 0.6 mg/dL (ref 0.2–1.2)
Total Protein: 6.7 g/dL (ref 6.1–8.1)
eGFR: 97 mL/min/{1.73_m2} (ref >=60)

## 2022-11-14 LAB — LIPID PANEL
Cholesterol: 192 mg/dL (ref <200)
HDL: 62 mg/dL (ref >=50)
LDL Cholesterol (Calc): 116 mg/dL (calc) — ABNORMAL HIGH
Non-HDL Cholesterol (Calc): 130 mg/dL (calc) — ABNORMAL HIGH (ref <130)
Total CHOL/HDL Ratio: 3.1 (calc) (ref <5.0)
Triglycerides: 50 mg/dL (ref <150)

## 2022-11-14 LAB — MICROALBUMIN / CREATININE URINE RATIO
Creatinine, Urine: 6 mg/dL — ABNORMAL LOW (ref 20–275)
Microalb, Ur: <0.2 mg/dL

## 2022-11-17 ENCOUNTER — Encounter: Payer: Self-pay | Admitting: Family Medicine

## 2022-11-25 ENCOUNTER — Ambulatory Visit
Admission: RE | Admit: 2022-11-25 | Discharge: 2022-11-25 | Disposition: A | Discharge status: Home / Self Care | Payer: Medicare HMO | Source: Ambulatory Visit | Attending: Family Medicine | Admitting: Family Medicine

## 2022-11-25 DIAGNOSIS — H579 Unspecified disorder of eye and adnexa: Secondary | ICD-10-CM | POA: Diagnosis not present

## 2022-11-25 DIAGNOSIS — I700 Atherosclerosis of aorta: Secondary | ICD-10-CM | POA: Diagnosis not present

## 2022-12-02 NOTE — Progress Notes (Signed)
Name: Ashlee Mccarthy   MRN: [id number]    DOB: Nov 20, 1951   Date:12/03/2022       Progress Note  Subjective  Chief Complaint  Annual Exam  HPI  Patient presents for annual CPE.  Diet: she eats out frequently since she lives alone, she chooses healthy options - like salads/soups, she also cooks at home  Exercise: continue regular physical activity   Last Eye Exam: up to date, CTA head negative after he contacted [place] for abnormal fundal exam Last Dental Exam: every 6 months   [place] Visit from 12/03/2022 in [facility]  AUDIT-C Score 0      Depression: Phq 9 is  negative    12/03/2022    8:05 AM 11/13/2022   10:27 AM 02/23/2022    1:56 PM 01/15/2022    9:10 AM 11/11/2021    2:32 PM  Depression screen PHQ 2/9  Decreased Interest 0 0 1 0 0  Down, Depressed, Hopeless 0 0 1 0 0  PHQ - 2 Score 0 0 2 0 0  Altered sleeping 0 0 0 0 0  Tired, decreased energy 0 0 0 0 0  Change in appetite 0 0 0 0 0  Feeling bad or failure about yourself  0 0 0 0 0  Trouble concentrating 0 0 0 0 0  Moving slowly or fidgety/restless 0 0 0 0 0  Suicidal thoughts 0 0 0 0 0  PHQ-9 Score 0 0 2 0 0  Difficult doing work/chores    Not difficult at all    Hypertension: BP Readings from Last 3 Encounters:  12/03/22 112/68  11/13/22 116/64  09/16/22 (!) 151/94   Obesity: Wt Readings from Last 3 Encounters:  12/03/22 152 lb (68.9 kg)  11/13/22 149 lb 11.2 oz (67.9 kg)  09/16/22 148 lb (67.1 kg)   BMI Readings from Last 3 Encounters:  12/03/22 26.09 kg/m  11/13/22 25.70 kg/m  09/16/22 25.40 kg/m     Vaccines:   RSV: discussed getting it at local pharmacy  Tdap: up to date Shingrix: N/A Pneumonia: up to date Flu: due, she is thinking about it  COVID-19: up to date   Hep C Screening: 09/16/16 STD testing and prevention (HIV/chl/gon/syphilis): N/A Intimate partner violence: negative screen  Sexual History : she has a boyfriend , monogamous  Menstrual  History/LMP/Abnormal Bleeding: hysterectomy  Discussed importance of follow up if any post-menopausal bleeding: yes  Incontinence Symptoms: negative for symptoms   Breast cancer:  - Last Mammogram: 10/19/22 - BRCA gene screening: N/A  Osteoporosis Prevention : Discussed high calcium and vitamin D supplementation, weight bearing exercises Bone density: 10/16/21 , she has osteoporosis, discussed risk of fractures and to consider seeing Endo   Cervical cancer screening: N/A  Skin cancer: Discussed monitoring for atypical lesions  Colorectal cancer: 11/21/18   Lung cancer:  Low Dose CT Chest recommended if Age 3-80 years, 20 pack-year currently smoking OR have quit w/in 15years. Patient does not qualify for screen   ECG: 01/25/22  Advanced Care Planning: A voluntary discussion about advance care planning including the explanation and discussion of advance directives.  Discussed health care proxy and Living will, and the patient was able to identify a health care proxy as daughter .  Patient does not have a living will and power of attorney of health care   Lipids: Lab Results  Component Value Date   CHOL 192 11/13/2022   CHOL 185 08/20/2021   CHOL 189 04/11/2020  Lab Results  Component Value Date   HDL 62 11/13/2022   HDL 61 08/20/2021   HDL 65 04/11/2020   Lab Results  Component Value Date   LDLCALC 116 (H) 11/13/2022   LDLCALC 110 (H) 08/20/2021   LDLCALC 111 (H) 04/11/2020   Lab Results  Component Value Date   TRIG 50 11/13/2022   TRIG 59 08/20/2021   TRIG 50 04/11/2020   Lab Results  Component Value Date   CHOLHDL 3.1 11/13/2022   CHOLHDL 3.0 08/20/2021   CHOLHDL 2.9 04/11/2020   No results found for: "LDLDIRECT"  Glucose: Glucose  Date Value Ref Range Status  08/24/2012 90 65 - 99 mg/dL Final   Glucose, Bld  Date Value Ref Range Status  11/13/2022 88 65 - 99 mg/dL Final    Comment:    .            Fasting reference interval .   08/20/2021 78 65 - 99  mg/dL Final    Comment:    .            Fasting reference interval .   04/11/2020 94 65 - 99 mg/dL Final    Comment:    .            Fasting reference interval .     Patient Active Problem List   Diagnosis Date Noted   Atherosclerosis of aorta ([place]) 02/23/2022   Coronary artery disease involving native coronary artery of native heart without angina pectoris 02/23/2022   Microalbuminuria 02/23/2022   Pure hypercholesterolemia 02/23/2022   Hallux valgus, acquired, bilateral 04/30/2020   Benign neoplasm of ascending colon    Osteoporosis 11/25/2016   Fever blister 09/16/2016   History of hysterectomy for benign disease 09/16/2016   Menopause syndrome 09/16/2016    Past Surgical History:  Procedure Laterality Date   ABDOMINAL HYSTERECTOMY     bladder tack     BUNIONECTOMY Right 09/16/2022   Procedure: LAPIDUS TYPE;  Surgeon: Samara Deist, DPM;  Location: [place];  Service: Podiatry;  Laterality: Right;   CATARACT EXTRACTION W/ INTRAOCULAR LENS  IMPLANT, BILATERAL     COLONOSCOPY WITH PROPOFOL N/A 11/21/2018   Procedure: COLONOSCOPY WITH BIOPSIES;  Surgeon: Lucilla Lame, MD;  Location: [place];  Service: Endoscopy;  Laterality: N/A;   EXCISION PARTIAL PHALANX Right 09/16/2022   Procedure: EXCISION PARTIAL PHALANX - SECOND;  Surgeon: Samara Deist, DPM;  Location: [place];  Service: Podiatry;  Laterality: Right;   OOPHORECTOMY     POLYPECTOMY N/A 11/21/2018   Procedure: POLYPECTOMY;  Surgeon: Lucilla Lame, MD;  Location: [place];  Service: Endoscopy;  Laterality: N/A;   TUBAL LIGATION      Family History  Problem Relation Age of Onset   Cirrhosis Mother    Lung cancer Father    Cancer Sister        ovarian   Ovarian cancer Sister    Cirrhosis Sister    COPD Sister    Peripheral Artery Disease Sister    Cancer Sister 67       pancreatic cancer   Cancer Brother        throat and lung   Colon cancer Maternal Aunt     Breast cancer Maternal Aunt        3 mat aunts   Breast cancer Maternal Aunt    Colon cancer Maternal Uncle    Breast cancer Other     Social History   Socioeconomic History  Marital status: Widowed    Spouse name: Not on file   Number of children: 2   Years of education: Not on file   Highest education level: Not on file  Occupational History   Occupation: Regulatory affairs officer   Tobacco Use   Smoking status: Never   Smokeless tobacco: Never  Vaping Use   Vaping Use: Never used  Substance and Sexual Activity   Alcohol use: No   Drug use: No   Sexual activity: Not Currently  Other Topics Concern   Not on file  Social History Narrative   She lost her second husband March [id number] from complications of PVD ( they were married for 16 years )   Two children from the first marriage   Lives alone   Social Determinants of Health   Financial Resource Strain: Low Risk  (12/03/2022)   Overall Financial Resource Strain (CARDIA)    Difficulty of Paying Living Expenses: Not hard at all  Food Insecurity: No Food Insecurity (12/03/2022)   Hunger Vital Sign    Worried About Running Out of Food in the Last Year: Never true    [place] in the Last Year: Never true  Transportation Needs: No Transportation Needs (12/03/2022)   PRAPARE - Hydrologist (Medical): No    Lack of Transportation (Non-Medical): No  Physical Activity: Sufficiently Active (12/03/2022)   Exercise Vital Sign    Days of Exercise per Week: 5 days    Minutes of Exercise per Session: 40 min  Stress: No Stress Concern Present (12/03/2022)   [place]    Feeling of Stress : Not at all  Social Connections: Moderately Integrated (12/03/2022)   Social Connection and Isolation Panel [NHANES]    Frequency of Communication with Friends and Family: More than three times a week    Frequency of Social Gatherings with Friends and Family:  Twice a week    Attends Religious Services: More than 4 times per year    Active Member of [employer] or Organizations: Yes    Attends Archivist Meetings: More than 4 times per year    Marital Status: Widowed  Intimate Partner Violence: Not At Risk (12/03/2022)   Humiliation, Afraid, Rape, and Kick questionnaire    Fear of Current or Ex-Partner: No    Emotionally Abused: No    Physically Abused: No    Sexually Abused: No     Current Outpatient Medications:    Cholecalciferol (VITAMIN D) 50 MCG (2000 UT) CAPS, Take 1 capsule by mouth daily., Disp: , Rfl:    latanoprost (XALATAN) 0.005 % ophthalmic solution, PLACE 1 DROP INTO BOTH EYES EVERY NIGHT AT BEDTIME, Disp: , Rfl: 4   vitamin E 1000 UNIT capsule, Take 1,000 Units by mouth daily., Disp: , Rfl:   Allergies  Allergen Reactions   Penicillins Rash     ROS  Constitutional: Negative for fever or weight change.  Respiratory: Negative for cough and shortness of breath.   Cardiovascular: Negative for chest pain or palpitations.  Gastrointestinal: Negative for abdominal pain, no bowel changes.  Musculoskeletal: Negative for gait problem or joint swelling.  Skin: Negative for rash.  Neurological: Negative for dizziness or headache.  No other specific complaints in a complete review of systems (except as listed in HPI above).   Objective  Vitals:   12/03/22 0805  BP: 112/68  Pulse: 92  Resp: 16  SpO2: 96%  Weight: 152  lb (68.9 kg)  Height: _0  (1.626 m)    Body mass index is 26.09 kg/m.  Physical Exam  Constitutional: Patient appears well-developed and well-nourished. No distress.  HENT: Head: Normocephalic and atraumatic. Ears: B TMs ok, no erythema or effusion; Nose: Nose normal. Mouth/Throat: Oropharynx is clear and moist. No oropharyngeal exudate.  Eyes: Conjunctivae and EOM are normal. Pupils are equal, round, and reactive to light. No scleral icterus.  Neck: Normal range of motion. Neck supple. No JVD  present. No thyromegaly present.  Cardiovascular: Normal rate, regular rhythm and normal heart sounds.  No murmur heard. No BLE edema. Pulmonary/Chest: Effort normal and breath sounds normal. No respiratory distress. Abdominal: Soft. Bowel sounds are normal, no distension. There is no tenderness. no masses Breast: no lumps or masses, no nipple discharge or rashes FEMALE GENITALIA:  Not done  RECTAL: not done  Musculoskeletal: Normal range of motion, no joint effusions. No gross deformities Neurological: he is alert and oriented to person, place, and time. No cranial nerve deficit. Coordination, balance, strength, speech and gait are normal.  Skin: Skin is warm and dry. No rash noted. No erythema.  Psychiatric: Patient has a normal mood and affect. behavior is normal. Judgment and thought content normal.   Recent Results (from the past 2160 hour(s))  Lipid panel     Status: Abnormal   Collection Time: 11/13/22 10:55 AM  Result Value Ref Range   Cholesterol 192 <200 mg/dL   HDL 62 > OR = 50 mg/dL   Triglycerides 50 <150 mg/dL   LDL Cholesterol (Calc) 116 (H) mg/dL (calc)    Comment: Reference range: <100 . Desirable range <100 mg/dL for primary prevention;   <70 mg/dL for patients with CHD or diabetic patients  with > or = 2 CHD risk factors. Marland Kitchen LDL-C is now calculated using the Martin-Hopkins  calculation, which is a validated novel method providing  better accuracy than the Friedewald equation in the  estimation of LDL-C.  Cresenciano Genre et al. Annamaria Helling. 5427;062(37): 2061-2068  (http://education.[facility].com/faq/FAQ164)    Total CHOL/HDL Ratio 3.1 <5.0 (calc)   Non-HDL Cholesterol (Calc) 130 (H) <130 mg/dL (calc)    Comment: For patients with diabetes plus 1 major ASCVD risk  factor, treating to a non-HDL-C goal of <100 mg/dL  (LDL-C of <70 mg/dL) is considered a therapeutic  option.   COMPLETE METABOLIC PANEL WITH GFR     Status: None   Collection Time: 11/13/22 10:55 AM   Result Value Ref Range   Glucose, Bld 88 65 - 99 mg/dL    Comment: .            Fasting reference interval .    BUN 12 7 - 25 mg/dL   Creat 0.59 0.50 - 1.05 mg/dL   eGFR 97 > OR = 60 mL/min/1.34m   BUN/Creatinine Ratio SEE NOTE: 6 - 22 (calc)    Comment:    Not Reported: BUN and Creatinine are within    reference range. .    Sodium 139 135 - 146 mmol/L   Potassium 4.1 3.5 - 5.3 mmol/L   Chloride 106 98 - 110 mmol/L   CO2 24 20 - 32 mmol/L   Calcium 9.5 8.6 - 10.4 mg/dL   Total Protein 6.7 6.1 - 8.1 g/dL   Albumin 4.3 3.6 - 5.1 g/dL   Globulin 2.4 1.9 - 3.7 g/dL (calc)   AG Ratio 1.8 1.0 - 2.5 (calc)   Total Bilirubin 0.6 0.2 - 1.2 mg/dL   Alkaline  phosphatase (APISO) 87 37 - 153 U/L   AST 19 10 - 35 U/L   ALT 8 6 - 29 U/L  Microalbumin / creatinine urine ratio     Status: Abnormal   Collection Time: 11/13/22 10:55 AM  Result Value Ref Range   Creatinine, Urine 6 (L) 20 - 275 mg/dL   Microalb, Ur <0.2 mg/dL    Comment: Reference Range Not established    Microalb Creat Ratio NOTE <30 mcg/mg creat    Comment: NOTE: The urine albumin value is less than  0.2 mg/dL therefore we are unable to calculate  excretion and/or creatinine ratio. . The ADA defines abnormalities in albumin excretion as follows: Marland Kitchen Albuminuria Category        Result (mcg/mg creatinine) . Normal to Mildly increased   <30 Moderately increased         30-299  Severely increased           > OR = 300 . The ADA recommends that at least two of three specimens collected within a 3-6 month period be abnormal before considering a patient to be within a diagnostic category.      Fall Risk:    12/03/2022    8:04 AM 11/13/2022   10:27 AM 02/23/2022    1:56 PM 01/15/2022    9:10 AM 11/11/2021    2:32 PM  Fall Risk   Falls in the past year? 0 0 0 0 0  Number falls in past yr: 0  0 0 0  Injury with Fall? 0  0 0 0  Risk for fall due to : No Fall Risks No Fall Risks No Fall Risks No Fall Risks No Fall  Risks  Follow up Falls prevention discussed Falls prevention discussed;Education provided;Falls evaluation completed Falls prevention discussed Falls prevention discussed Falls prevention discussed     Functional Status Survey: Is the patient deaf or have difficulty hearing?: No Does the patient have difficulty seeing, even when wearing glasses/contacts?: No Does the patient have difficulty concentrating, remembering, or making decisions?: No Does the patient have difficulty walking or climbing stairs?: No Does the patient have difficulty dressing or bathing?: No Does the patient have difficulty doing errands alone such as visiting a doctor's office or shopping?: No   Assessment & Plan   1. Well adult exam   2. Age-related osteoporosis without current pathological fracture  She is not interested in therapy   3. Atherosclerosis of aorta (HCC)  Cannot tolerate statin therapy , discussed baby aspirin and zetia but not interested   4. Statin myopathy       -USPSTF grade A and B recommendations reviewed with patient; age-appropriate recommendations, preventive care, screening tests, etc discussed and encouraged; healthy living encouraged; see AVS for patient education given to patient -Discussed importance of 150 minutes of physical activity weekly, eat two servings of fish weekly, eat one serving of tree nuts ( cashews, pistachios, pecans, almonds.Marland Kitchen) every other day, eat 6 servings of fruit/vegetables daily and drink plenty of water and avoid sweet beverages.   -Reviewed Health Maintenance: Yes.

## 2022-12-02 NOTE — Patient Instructions (Signed)
Preventive Care 65 Years and Older, Female Preventive care refers to lifestyle choices and visits with your health care provider that can promote health and wellness. Preventive care visits are also called wellness exams. What can I expect for my preventive care visit? Counseling Your health care provider may ask you questions about your: Medical history, including: Past medical problems. Family medical history. Pregnancy and menstrual history. History of falls. Current health, including: Memory and ability to understand (cognition). Emotional well-being. Home life and relationship well-being. Sexual activity and sexual health. Lifestyle, including: Alcohol, nicotine or tobacco, and drug use. Access to firearms. Diet, exercise, and sleep habits. Work and work environment. Sunscreen use. Safety issues such as seatbelt and bike helmet use. Physical exam Your health care provider will check your: Height and weight. These may be used to calculate your BMI (body mass index). BMI is a measurement that tells if you are at a healthy weight. Waist circumference. This measures the distance around your waistline. This measurement also tells if you are at a healthy weight and may help predict your risk of certain diseases, such as type 2 diabetes and high blood pressure. Heart rate and blood pressure. Body temperature. Skin for abnormal spots. What immunizations do I need?  Vaccines are usually given at various ages, according to a schedule. Your health care provider will recommend vaccines for you based on your age, medical history, and lifestyle or other factors, such as travel or where you work. What tests do I need? Screening Your health care provider may recommend screening tests for certain conditions. This may include: Lipid and cholesterol levels. Hepatitis C test. Hepatitis B test. HIV (human immunodeficiency virus) test. STI (sexually transmitted infection) testing, if you are at  risk. Lung cancer screening. Colorectal cancer screening. Diabetes screening. This is done by checking your blood sugar (glucose) after you have not eaten for a while (fasting). Mammogram. Talk with your health care provider about how often you should have regular mammograms. BRCA-related cancer screening. This may be done if you have a family history of breast, ovarian, tubal, or peritoneal cancers. Bone density scan. This is done to screen for osteoporosis. Talk with your health care provider about your test results, treatment options, and if necessary, the need for more tests. Follow these instructions at home: Eating and drinking  Eat a diet that includes fresh fruits and vegetables, whole grains, lean protein, and low-fat dairy products. Limit your intake of foods with high amounts of sugar, saturated fats, and salt. Take vitamin and mineral supplements as recommended by your health care provider. Do not drink alcohol if your health care provider tells you not to drink. If you drink alcohol: Limit how much you have to 0-1 drink a day. Know how much alcohol is in your drink. In the [place] one drink equals one 12 oz bottle of beer (355 mL), one 5 oz glass of wine (148 mL), or one 1 oz glass of hard liquor (44 mL). Lifestyle Brush your teeth every morning and night with fluoride toothpaste. Floss one time each day. Exercise for at least 30 minutes 5 or more days each week. Do not use any products that contain nicotine or tobacco. These products include cigarettes, chewing tobacco, and vaping devices, such as e-cigarettes. If you need help quitting, ask your health care provider. Do not use drugs. If you are sexually active, practice safe sex. Use a condom or other form of protection in order to prevent STIs. Take aspirin only as told by   your health care provider. Make sure that you understand how much to take and what form to take. Work with your health care provider to find out whether it  is safe and beneficial for you to take aspirin daily. Ask your health care provider if you need to take a cholesterol-lowering medicine (statin). Find healthy ways to manage stress, such as: Meditation, yoga, or listening to music. Journaling. Talking to a trusted person. Spending time with friends and family. Minimize exposure to UV radiation to reduce your risk of skin cancer. Safety Always wear your seat belt while driving or riding in a vehicle. Do not drive: If you have been drinking alcohol. Do not ride with someone who has been drinking. When you are tired or distracted. While texting. If you have been using any mind-altering substances or drugs. Wear a helmet and other protective equipment during sports activities. If you have firearms in your house, make sure you follow all gun safety procedures. What's next? Visit your health care provider once a year for an annual wellness visit. Ask your health care provider how often you should have your eyes and teeth checked. Stay up to date on all vaccines. This information is not intended to replace advice given to you by your health care provider. Make sure you discuss any questions you have with your health care provider. Document Revised: 06/11/2021 Document Reviewed: 06/11/2021 Elsevier Patient Education  2023 Elsevier Inc.  

## 2022-12-03 ENCOUNTER — Ambulatory Visit (INDEPENDENT_AMBULATORY_CARE_PROVIDER_SITE_OTHER): Payer: Medicare HMO | Admitting: Family Medicine

## 2022-12-03 ENCOUNTER — Encounter: Payer: Self-pay | Admitting: Family Medicine

## 2022-12-03 VITALS — BP 112/68 | HR 92 | Resp 16 | Ht 64.0 in | Wt 152.0 lb

## 2022-12-03 DIAGNOSIS — Z0000 Encounter for general adult medical examination without abnormal findings: Secondary | ICD-10-CM | POA: Diagnosis not present

## 2022-12-03 DIAGNOSIS — M810 Age-related osteoporosis without current pathological fracture: Secondary | ICD-10-CM | POA: Diagnosis not present

## 2022-12-03 DIAGNOSIS — T466X5A Adverse effect of antihyperlipidemic and antiarteriosclerotic drugs, initial encounter: Secondary | ICD-10-CM | POA: Diagnosis not present

## 2022-12-03 DIAGNOSIS — G720 Drug-induced myopathy: Secondary | ICD-10-CM

## 2022-12-03 DIAGNOSIS — I700 Atherosclerosis of aorta: Secondary | ICD-10-CM | POA: Diagnosis not present

## 2022-12-08 DIAGNOSIS — M2011 Hallux valgus (acquired), right foot: Secondary | ICD-10-CM | POA: Diagnosis not present

## 2022-12-10 DIAGNOSIS — H40153 Residual stage of open-angle glaucoma, bilateral: Secondary | ICD-10-CM | POA: Diagnosis not present

## 2023-01-05 ENCOUNTER — Encounter
Admission: RE | Admit: 2023-01-05 | Discharge: 2023-01-05 | Disposition: A | Discharge status: Home / Self Care | Payer: Medicare HMO | Source: Ambulatory Visit | Attending: Podiatry | Admitting: Podiatry

## 2023-01-05 ENCOUNTER — Encounter: Payer: Self-pay | Admitting: Urgent Care

## 2023-01-05 ENCOUNTER — Other Ambulatory Visit: Payer: Self-pay

## 2023-01-05 ENCOUNTER — Other Ambulatory Visit: Payer: Self-pay | Admitting: Podiatry

## 2023-01-05 VITALS — BP 157/75 | HR 80 | Resp 20 | Ht 64.0 in | Wt 151.0 lb

## 2023-01-05 DIAGNOSIS — I700 Atherosclerosis of aorta: Secondary | ICD-10-CM | POA: Insufficient documentation

## 2023-01-05 DIAGNOSIS — Z01810 Encounter for preprocedural cardiovascular examination: Secondary | ICD-10-CM | POA: Insufficient documentation

## 2023-01-05 DIAGNOSIS — M2012 Hallux valgus (acquired), left foot: Secondary | ICD-10-CM | POA: Diagnosis not present

## 2023-01-05 DIAGNOSIS — I2510 Atherosclerotic heart disease of native coronary artery without angina pectoris: Secondary | ICD-10-CM | POA: Diagnosis not present

## 2023-01-05 DIAGNOSIS — I447 Left bundle-branch block, unspecified: Secondary | ICD-10-CM | POA: Diagnosis not present

## 2023-01-05 DIAGNOSIS — E7800 Pure hypercholesterolemia, unspecified: Secondary | ICD-10-CM

## 2023-01-05 HISTORY — DX: Drug-induced myopathy: T46.6X5A

## 2023-01-05 HISTORY — DX: Palpitations: R00.2

## 2023-01-05 HISTORY — DX: Atherosclerotic heart disease of native coronary artery without angina pectoris: I25.10

## 2023-01-05 HISTORY — DX: Hyperlipidemia, unspecified: E78.5

## 2023-01-05 HISTORY — DX: Atherosclerosis of aorta: I70.0

## 2023-01-05 HISTORY — DX: Left bundle-branch block, unspecified: I44.7

## 2023-01-05 HISTORY — DX: Drug-induced myopathy: G72.0

## 2023-01-05 NOTE — Progress Notes (Addendum)
Perioperative Services Pre-Admission/Anesthesia Testing    Date: 01/05/23  Name: Ashlee Mccarthy MRN:   [id number]  Re: ECG changes and need for preoperative evaluation and clearance  Planned Surgical Procedure(s):    Case: [id number] Date/Time: 01/08/23 1152   Procedure: BUNIONECTOMY (Left: Toe)   Anesthesia type: Choice   Pre-op diagnosis: M20.12 - Hallux Valgus of left foot   Location: [facility] OR ROOM 02 / [facility]   Surgeons: Samara Deist, DPM   Clinical Notes:  Patient is scheduled for the above procedure on 01/08/2023 with Dr. Samara Deist, DPM. In preparation for her procedure, patient presented to the PAT clinic on the afternoon of 01/05/2023 for preoperative testing.  In review of her preoperative ECG, patient with a presumably new LBBB when comparing today's tracing to those available and both the [facility] and [facility]. Of note, patient has seen cardiology in the past.  Patient was seen by Dr. Serafina Royals on 02/19/2022 for complaints of heart palpitations; notes reviewed.  Per notes from cardiology, palpitations were determined to be benign.  No further cardiovascular testing was recommended at that time.  In review of her EMR, patient with a history of coronary artery disease, aortic atherosclerosis, and hyperlipidemia.  There is no documented history of HTN or T2DM.  Patient does not use alcohol, nor has she ever smoked in the past.  I do not see a history significant for significant familial cardiovascular disease.  Patient seen by PCP in 10/2022 at which time she complained of shortness of breath.  Unfortunately, I did not get to see patient today while in the PAT clinic. PAT staff and lab technician note that patient was well appearing and did not have any complaints of chest pain or significant SOB while here today.   ECG:    Impression and Plan:  BANI GIANFRANCESCO found to have presumably acute ECG changes on preoperative tracing  performed today (01/05/2023).  Patient has been seen by cardiology in the past.  Will send copy of today's ECG over to patient's primary cardiology team for further evaluation.  Anticipate need for further noninvasive cardiovascular testing given the findings on her ECG today.   Attending surgeons office made aware of today's findings and need for further evaluation with cardiology prior to proceeding with elective podiatric case.  I am unsure of the availability of cardiology provider to see patient prior to surgery date that is already scheduled given recent retirement of a few of their providers.  No changes are being made to the OR schedule at this time.  Office aware that patient is pending cardiovascular evaluation and clearance.  Case may need to be rescheduled.  ADDENDUM: 01/05/2023 at 1622 -  Call placed to patient to discuss. Reviewed findings and potential implications. Advised patient that in order to ensure her safety for surgery, and beyond, we need to have this further evaluated by her cardiology team. Questions fielded to satisfaction. Patient advised that Dr. Alvera Singh office closed for the day due to the weather. This is also true for her cardiologist's office. I advised her that I had sent Dr. Vickki Muff a note outlining my concerns. Patient advised that surgery would most likely need to be rescheduled pending cardiovascular evaluation and any testing deemed necessary.  Patient verbalized understanding of plan of care.  She was asked to place a call to her cardiologist's office tomorrow morning to determine presurgical plan of care.  Honor Loh, MSN, APRN, FNP-C, [facility] Nurse Practitioner Phone: [phone] 01/05/23 3:49 PM  NOTE: This note has been prepared using Dragon dictation software. Despite my best ability to proofread, there is always the potential that unintentional transcriptional errors may still occur from this process.

## 2023-01-05 NOTE — Patient Instructions (Addendum)
Your procedure is scheduled on: 01/08/23 Report to [facility]. To find out your arrival time please call [phone] between 1PM - 3PM on 01/07/23.  Remember: Instructions that are not followed completely may result in serious medical risk, up to and including death, or upon the discretion of your surgeon and anesthesiologist your surgery may need to be rescheduled.     _X__ 1. Do not eat food after midnight the night before your procedure.                 No gum chewing or hard candies. You may drink clear liquids up to 2 hours                 before you are scheduled to arrive for your surgery- DO not drink clear                 liquids within 2 hours of the start of your surgery.                 Clear Liquids include:  water, apple juice without pulp, clear carbohydrate                 drink such as Clearfast or Gatorade, Black Coffee or Tea (Do not add                 anything to coffee or tea). Diabetics water only  Drink the Ensure "clear" pre surgery drink 2 hours prior to arrival for surgery.  __X__2.  On the morning of surgery brush your teeth with toothpaste and water, you                 may rinse your mouth with mouthwash if you wish.  Do not swallow any              toothpaste of mouthwash.     _X__ 3.  No Alcohol for 24 hours before or after surgery.   _X__ 4.  Do Not Smoke or use e-cigarettes For 24 Hours Prior to Your Surgery.                 Do not use any chewable tobacco products for at least 6 hours prior to                 surgery.  ____  5.  Bring all medications with you on the day of surgery if instructed.   __X__  6.  Notify your doctor if there is any change in your medical condition      (cold, fever, infections).     Do not wear jewelry, make-up, hairpins, clips or nail polish. Do not wear lotions, powders, or perfumes. You may wear deodorant. Do not shave body hair 48 hours prior to surgery. Men may  shave face and neck. Do not bring valuables to the hospital.    [facility] is not responsible for any belongings or valuables.  Contacts, dentures/partials or body piercings may not be worn into surgery. Bring a case for your contacts, glasses or hearing aids, a denture cup will be supplied. Leave your suitcase in the car. After surgery it may be brought to your room. For patients admitted to the hospital, discharge time is determined by your treatment team.   Patients discharged the day of surgery will not be allowed to drive home.   Please read over the following fact sheets that you were given:  MRSA Information, CHG soap, Incentive Spirometer, Ensure  __X__ Take these medicines the morning of surgery with A SIP OF WATER:    1. none  2.   3.   ____ Fleet Enema (as directed)   __X__ Use CHG Soap/SAGE wipes as directed  ____ Use inhalers on the day of surgery  ____ Stop metformin/Janumet/Farxiga 2 days prior to surgery    ____ Take 1/2 of usual insulin dose the night before surgery. No insulin the morning          of surgery.   ____ Stop Blood Thinners Coumadin/Plavix/Xarelto/Pleta/Pradaxa/Eliquis/Effient/Aspirin  on   Or contact your Surgeon, Cardiologist or Medical Doctor regarding  ability to stop your blood thinners  __X__ Stop Anti-inflammatories 7 days before surgery such as Advil, Ibuprofen, Motrin,  BC or Goodies Powder, Naprosyn, Naproxen, Aleve, Aspirin   You can take Tylenol if needed  __X__ Stop all herbals and supplements, fish oil or vitamins  until after surgery.    ____ Bring C-Pap to the hospital.       Preparing for Surgery with CHLORHEXIDINE GLUCONATE (CHG) Soap  Chlorhexidine Gluconate (CHG) Soap  o An antiseptic cleaner that kills germs and bonds with the skin to continue killing germs even after washing  o Used for showering the night before surgery and morning of surgery  Before surgery, you can play an important role by reducing the  number of germs on your skin.  CHG (Chlorhexidine gluconate) soap is an antiseptic cleanser which kills germs and bonds with the skin to continue killing germs even after washing.  Please do not use if you have an allergy to CHG or antibacterial soaps. If your skin becomes reddened/irritated stop using the CHG.  1. Shower the NIGHT BEFORE SURGERY and the MORNING OF SURGERY with CHG soap.  2. If you choose to wash your hair, wash your hair first as usual with your normal shampoo.  3. After shampooing, rinse your hair and body thoroughly to remove the shampoo.  4. Use CHG as you would any other liquid soap. You can apply CHG directly to the skin and wash gently with a scrungie or a clean washcloth.  5. Apply the CHG soap to your body only from the neck down. Do not use on open wounds or open sores. Avoid contact with your eyes, ears, mouth, and genitals (private parts). Wash face and genitals (private parts) with your normal soap.  6. Wash thoroughly, paying special attention to the area where your surgery will be performed.  7. Thoroughly rinse your body with warm water.  8. Do not shower/wash with your normal soap after using and rinsing off the CHG soap.  9. Pat yourself dry with a clean towel.  10. Wear clean pajamas to bed the night before surgery.  12. Place clean sheets on your bed the night of your first shower and do not sleep with pets.  13. Shower again with the CHG soap on the day of surgery prior to arriving at the hospital.  14. Do not apply any deodorants/lotions/powders.  15. Please wear clean clothes to the hospital.

## 2023-01-06 DIAGNOSIS — E7800 Pure hypercholesterolemia, unspecified: Secondary | ICD-10-CM | POA: Diagnosis not present

## 2023-01-06 DIAGNOSIS — R0602 Shortness of breath: Secondary | ICD-10-CM | POA: Diagnosis not present

## 2023-01-06 DIAGNOSIS — I447 Left bundle-branch block, unspecified: Secondary | ICD-10-CM | POA: Diagnosis not present

## 2023-01-06 DIAGNOSIS — I2510 Atherosclerotic heart disease of native coronary artery without angina pectoris: Secondary | ICD-10-CM | POA: Diagnosis not present

## 2023-01-06 DIAGNOSIS — R9431 Abnormal electrocardiogram [ECG] [EKG]: Secondary | ICD-10-CM | POA: Diagnosis not present

## 2023-01-08 ENCOUNTER — Encounter: Admission: RE | Payer: Self-pay | Source: Home / Self Care

## 2023-01-08 ENCOUNTER — Ambulatory Visit: Admission: RE | Admit: 2023-01-08 | Payer: Medicare HMO | Source: Home / Self Care | Admitting: Podiatry

## 2023-01-08 SURGERY — BUNIONECTOMY
Anesthesia: Choice | Site: Toe | Laterality: Left

## 2023-01-21 DIAGNOSIS — R9431 Abnormal electrocardiogram [ECG] [EKG]: Secondary | ICD-10-CM | POA: Diagnosis not present

## 2023-01-21 DIAGNOSIS — I447 Left bundle-branch block, unspecified: Secondary | ICD-10-CM | POA: Diagnosis not present

## 2023-01-21 DIAGNOSIS — R0602 Shortness of breath: Secondary | ICD-10-CM | POA: Diagnosis not present

## 2023-01-25 DIAGNOSIS — H10021 Other mucopurulent conjunctivitis, right eye: Secondary | ICD-10-CM | POA: Diagnosis not present

## 2023-01-28 ENCOUNTER — Other Ambulatory Visit: Payer: Self-pay | Admitting: Cardiology

## 2023-01-28 DIAGNOSIS — I2510 Atherosclerotic heart disease of native coronary artery without angina pectoris: Secondary | ICD-10-CM

## 2023-01-28 DIAGNOSIS — Z01812 Encounter for preprocedural laboratory examination: Secondary | ICD-10-CM | POA: Diagnosis not present

## 2023-01-28 DIAGNOSIS — I447 Left bundle-branch block, unspecified: Secondary | ICD-10-CM | POA: Diagnosis not present

## 2023-01-28 DIAGNOSIS — E7800 Pure hypercholesterolemia, unspecified: Secondary | ICD-10-CM | POA: Diagnosis not present

## 2023-01-28 DIAGNOSIS — R9439 Abnormal result of other cardiovascular function study: Secondary | ICD-10-CM

## 2023-01-28 DIAGNOSIS — I493 Ventricular premature depolarization: Secondary | ICD-10-CM | POA: Diagnosis not present

## 2023-02-03 ENCOUNTER — Telehealth (HOSPITAL_COMMUNITY): Payer: Self-pay | Admitting: Emergency Medicine

## 2023-02-03 DIAGNOSIS — R079 Chest pain, unspecified: Secondary | ICD-10-CM

## 2023-02-03 MED ORDER — IVABRADINE HCL 5 MG PO TABS: 10.0000 mg | ORAL_TABLET | Freq: Once | ORAL | 0 refills | Status: AC

## 2023-02-03 MED ORDER — METOPROLOL TARTRATE 100 MG PO TABS: 100.0000 mg | ORAL_TABLET | Freq: Once | ORAL | 0 refills | Status: DC

## 2023-02-03 NOTE — Telephone Encounter (Signed)
Reaching out to patient to offer assistance regarding upcoming cardiac imaging study; pt verbalizes understanding of appt date/time, parking situation and where to check in, pre-test NPO status and medications ordered, and verified current allergies; name and call back number provided for further questions should they arise Ashlee Bond RN Navigator Cardiac Imaging [facility] [phone] office [phone] cell  Arrival 1215 [facility] '100mg'$  metop + '10mg'$  ivabradine

## 2023-02-04 ENCOUNTER — Telehealth (HOSPITAL_COMMUNITY): Payer: Self-pay | Admitting: Emergency Medicine

## 2023-02-04 ENCOUNTER — Ambulatory Visit
Admission: RE | Admit: 2023-02-04 | Discharge: 2023-02-04 | Disposition: A | Discharge status: Home / Self Care | Payer: Medicare HMO | Source: Ambulatory Visit | Attending: Cardiology | Admitting: Cardiology

## 2023-02-04 DIAGNOSIS — I447 Left bundle-branch block, unspecified: Secondary | ICD-10-CM | POA: Diagnosis not present

## 2023-02-04 DIAGNOSIS — R079 Chest pain, unspecified: Secondary | ICD-10-CM

## 2023-02-04 DIAGNOSIS — I2510 Atherosclerotic heart disease of native coronary artery without angina pectoris: Secondary | ICD-10-CM | POA: Diagnosis present

## 2023-02-04 DIAGNOSIS — R9439 Abnormal result of other cardiovascular function study: Secondary | ICD-10-CM | POA: Diagnosis present

## 2023-02-04 MED ORDER — IOHEXOL 350 MG/ML SOLN
75.0000 mL | Freq: Once | INTRAVENOUS | Status: AC | PRN
  Administered 2023-02-04: 75 mL via INTRAVENOUS

## 2023-02-04 MED ORDER — NITROGLYCERIN 0.4 MG SL SUBL
0.8000 mg | SUBLINGUAL_TABLET | Freq: Once | SUBLINGUAL | Status: AC
  Administered 2023-02-04: 0.8 mg via SUBLINGUAL

## 2023-02-04 MED ORDER — IVABRADINE HCL 5 MG PO TABS: 10.0000 mg | ORAL_TABLET | Freq: Once | ORAL | 0 refills | Status: AC

## 2023-02-04 NOTE — Progress Notes (Signed)
Patient tolerated CT well. Drank water after. Vital signs stable encourage to drink water throughout day.Reasons explained and verbalized understanding. Ambulated steady gait. Patient tolerated CT well. Drank water after. Vital signs stable encourage to drink water throughout day.Reasons explained and verbalized understanding. Ambulated steady gait.

## 2023-02-04 NOTE — Telephone Encounter (Signed)
Sent ivabradine to [facility] [address] since not available at [employer].  Marchia Bond RN Navigator Cardiac Imaging [facility] [phone] Office  [phone] Cell

## 2023-02-11 ENCOUNTER — Ambulatory Visit: Admission: RE | Admit: 2023-02-11 | Payer: Medicare HMO | Source: Ambulatory Visit

## 2023-02-11 DIAGNOSIS — H35433 Paving stone degeneration of retina, bilateral: Secondary | ICD-10-CM | POA: Diagnosis not present

## 2023-02-11 DIAGNOSIS — H43391 Other vitreous opacities, right eye: Secondary | ICD-10-CM | POA: Diagnosis not present

## 2023-02-11 DIAGNOSIS — H43813 Vitreous degeneration, bilateral: Secondary | ICD-10-CM | POA: Diagnosis not present

## 2023-02-11 DIAGNOSIS — H33311 Horseshoe tear of retina without detachment, right eye: Secondary | ICD-10-CM | POA: Diagnosis not present

## 2023-02-11 DIAGNOSIS — H43811 Vitreous degeneration, right eye: Secondary | ICD-10-CM | POA: Diagnosis not present

## 2023-02-11 DIAGNOSIS — H2513 Age-related nuclear cataract, bilateral: Secondary | ICD-10-CM | POA: Diagnosis not present

## 2023-02-16 DIAGNOSIS — I447 Left bundle-branch block, unspecified: Secondary | ICD-10-CM | POA: Diagnosis not present

## 2023-02-18 DIAGNOSIS — I2510 Atherosclerotic heart disease of native coronary artery without angina pectoris: Secondary | ICD-10-CM | POA: Diagnosis not present

## 2023-02-18 DIAGNOSIS — I447 Left bundle-branch block, unspecified: Secondary | ICD-10-CM | POA: Diagnosis not present

## 2023-02-18 DIAGNOSIS — E7800 Pure hypercholesterolemia, unspecified: Secondary | ICD-10-CM | POA: Diagnosis not present

## 2023-02-18 DIAGNOSIS — I700 Atherosclerosis of aorta: Secondary | ICD-10-CM | POA: Diagnosis not present

## 2023-02-25 DIAGNOSIS — H43813 Vitreous degeneration, bilateral: Secondary | ICD-10-CM | POA: Diagnosis not present

## 2023-02-25 DIAGNOSIS — H2513 Age-related nuclear cataract, bilateral: Secondary | ICD-10-CM | POA: Diagnosis not present

## 2023-02-25 DIAGNOSIS — H59811 Chorioretinal scars after surgery for detachment, right eye: Secondary | ICD-10-CM | POA: Diagnosis not present

## 2023-02-25 DIAGNOSIS — H43391 Other vitreous opacities, right eye: Secondary | ICD-10-CM | POA: Diagnosis not present

## 2023-02-25 DIAGNOSIS — H35433 Paving stone degeneration of retina, bilateral: Secondary | ICD-10-CM | POA: Diagnosis not present

## 2023-03-11 ENCOUNTER — Ambulatory Visit (INDEPENDENT_AMBULATORY_CARE_PROVIDER_SITE_OTHER): Payer: Medicare HMO

## 2023-03-11 VITALS — Ht 64.0 in | Wt 151.0 lb

## 2023-03-11 DIAGNOSIS — Z0000 Encounter for general adult medical examination without abnormal findings: Secondary | ICD-10-CM

## 2023-03-11 NOTE — Patient Instructions (Signed)
Ashlee Mccarthy , Thank you for taking time to come for your Medicare Wellness Visit. I appreciate your ongoing commitment to your health goals. Please review the following plan we discussed and let me know if I can assist you in the future.   These are the goals we discussed:  Goals   None     This is a list of the screening recommended for you and due dates:  Health Maintenance  Topic Date Due   Zoster (Shingles) Vaccine (1 of 2) Never done   COVID-19 Vaccine (3 - Pfizer risk series) 04/01/2020   Flu Shot  03/28/2023*   DEXA scan (bone density measurement)  10/17/2023   Colon Cancer Screening  11/22/2023   Medicare Annual Wellness Visit  03/10/2024   Mammogram  10/19/2024   DTaP/Tdap/Td vaccine (2 - Td or Tdap) 09/16/2026   Pneumonia Vaccine  Completed   Hepatitis C Screening: USPSTF Recommendation to screen - Ages 70-79 yo.  Completed   HPV Vaccine  Aged Out  *Topic was postponed. The date shown is not the original due date.    Advanced directives: [id number] paperwork per pt request  Conditions/risks identified: none  Next appointment: Follow up in one year for your annual wellness visit 03/16/2024 '@8'$ :45am telephone   Preventive Care 65 Years and Older, Female Preventive care refers to lifestyle choices and visits with your health care provider that can promote health and wellness. What does preventive care include? A yearly physical exam. This is also called an annual well check. Dental exams once or twice a year. Routine eye exams. Ask your health care provider how often you should have your eyes checked. Personal lifestyle choices, including: Daily care of your teeth and gums. Regular physical activity. Eating a healthy diet. Avoiding tobacco and drug use. Limiting alcohol use. Practicing safe sex. Taking low-dose aspirin every day. Taking vitamin and mineral supplements as recommended by your health care provider. What happens during an annual well check? The  services and screenings done by your health care provider during your annual well check will depend on your age, overall health, lifestyle risk factors, and family history of disease. Counseling  Your health care provider may ask you questions about your: Alcohol use. Tobacco use. Drug use. Emotional well-being. Home and relationship well-being. Sexual activity. Eating habits. History of falls. Memory and ability to understand (cognition). Work and work Statistician. Reproductive health. Screening  You may have the following tests or measurements: Height, weight, and BMI. Blood pressure. Lipid and cholesterol levels. These may be checked every 5 years, or more frequently if you are over 23 years old. Skin check. Lung cancer screening. You may have this screening every year starting at age 55 if you have a 30-pack-year history of smoking and currently smoke or have quit within the past 15 years. Fecal occult blood test (FOBT) of the stool. You may have this test every year starting at age 24. Flexible sigmoidoscopy or colonoscopy. You may have a sigmoidoscopy every 5 years or a colonoscopy every 10 years starting at age 26. Hepatitis C blood test. Hepatitis B blood test. Sexually transmitted disease (STD) testing. Diabetes screening. This is done by checking your blood sugar (glucose) after you have not eaten for a while (fasting). You may have this done every 1-3 years. Bone density scan. This is done to screen for osteoporosis. You may have this done starting at age 25. Mammogram. This may be done every 1-2 years. Talk to your health care provider about how  often you should have regular mammograms. Talk with your health care provider about your test results, treatment options, and if necessary, the need for more tests. Vaccines  Your health care provider may recommend certain vaccines, such as: Influenza vaccine. This is recommended every year. Tetanus, diphtheria, and acellular  pertussis (Tdap, Td) vaccine. You may need a Td booster every 10 years. Zoster vaccine. You may need this after age 6. Pneumococcal 13-valent conjugate (PCV13) vaccine. One dose is recommended after age 40. Pneumococcal polysaccharide (PPSV23) vaccine. One dose is recommended after age 13. Talk to your health care provider about which screenings and vaccines you need and how often you need them. This information is not intended to replace advice given to you by your health care provider. Make sure you discuss any questions you have with your health care provider. Document Released: 01/10/2016 Document Revised: 09/02/2016 Document Reviewed: 10/15/2015 Elsevier Interactive Patient Education  2017 [place] Prevention in the Home Falls can cause injuries. They can happen to people of all ages. There are many things you can do to make your home safe and to help prevent falls. What can I do on the outside of my home? Regularly fix the edges of walkways and driveways and fix any cracks. Remove anything that might make you trip as you walk through a door, such as a raised step or threshold. Trim any bushes or trees on the path to your home. Use bright outdoor lighting. Clear any walking paths of anything that might make someone trip, such as rocks or tools. Regularly check to see if handrails are loose or broken. Make sure that both sides of any steps have handrails. Any raised decks and porches should have guardrails on the edges. Have any leaves, snow, or ice cleared regularly. Use sand or salt on walking paths during winter. Clean up any spills in your garage right away. This includes oil or grease spills. What can I do in the bathroom? Use night lights. Install grab bars by the toilet and in the tub and shower. Do not use towel bars as grab bars. Use non-skid mats or decals in the tub or shower. If you need to sit down in the shower, use a plastic, non-slip stool. Keep the floor  dry. Clean up any water that spills on the floor as soon as it happens. Remove soap buildup in the tub or shower regularly. Attach bath mats securely with double-sided non-slip rug tape. Do not have throw rugs and other things on the floor that can make you trip. What can I do in the bedroom? Use night lights. Make sure that you have a light by your bed that is easy to reach. Do not use any sheets or blankets that are too big for your bed. They should not hang down onto the floor. Have a firm chair that has side arms. You can use this for support while you get dressed. Do not have throw rugs and other things on the floor that can make you trip. What can I do in the kitchen? Clean up any spills right away. Avoid walking on wet floors. Keep items that you use a lot in easy-to-reach places. If you need to reach something above you, use a strong step stool that has a grab bar. Keep electrical cords out of the way. Do not use floor polish or wax that makes floors slippery. If you must use wax, use non-skid floor wax. Do not have throw rugs and other things  on the floor that can make you trip. What can I do with my stairs? Do not leave any items on the stairs. Make sure that there are handrails on both sides of the stairs and use them. Fix handrails that are broken or loose. Make sure that handrails are as long as the stairways. Check any carpeting to make sure that it is firmly attached to the stairs. Fix any carpet that is loose or worn. Avoid having throw rugs at the top or bottom of the stairs. If you do have throw rugs, attach them to the floor with carpet tape. Make sure that you have a light switch at the top of the stairs and the bottom of the stairs. If you do not have them, ask someone to add them for you. What else can I do to help prevent falls? Wear shoes that: Do not have high heels. Have rubber bottoms. Are comfortable and fit you well. Are closed at the toe. Do not wear  sandals. If you use a stepladder: Make sure that it is fully opened. Do not climb a closed stepladder. Make sure that both sides of the stepladder are locked into place. Ask someone to hold it for you, if possible. Clearly mark and make sure that you can see: Any grab bars or handrails. First and last steps. Where the edge of each step is. Use tools that help you move around (mobility aids) if they are needed. These include: Canes. Walkers. Scooters. Crutches. Turn on the lights when you go into a dark area. Replace any light bulbs as soon as they burn out. Set up your furniture so you have a clear path. Avoid moving your furniture around. If any of your floors are uneven, fix them. If there are any pets around you, be aware of where they are. Review your medicines with your doctor. Some medicines can make you feel dizzy. This can increase your chance of falling. Ask your doctor what other things that you can do to help prevent falls. This information is not intended to replace advice given to you by your health care provider. Make sure you discuss any questions you have with your health care provider. Document Released: 10/10/2009 Document Revised: 05/21/2016 Document Reviewed: 01/18/2015 Elsevier Interactive Patient Education  2017 [employer].

## 2023-03-11 NOTE — Progress Notes (Signed)
I connected with  Ashlee Mccarthy on 03/11/23 by a audio enabled telemedicine application and verified that I am speaking with the correct person using two identifiers.  Patient Location: Home  Provider Location: Office/Clinic  I discussed the limitations of evaluation and management by telemedicine. The patient expressed understanding and agreed to proceed.  Subjective:   Ashlee Mccarthy is a 72 y.o. female who presents for Medicare Annual (Subsequent) preventive examination.  Review of Systems    Cardiac Risk Factors include: advanced age (>51mn, >>65women);hypertension    Objective:    Today's Vitals   03/11/23 0838  Weight: 151 lb (68.5 kg)  Height: '5\' 4"'$  (1.626 m)   Body mass index is 25.92 kg/m.     03/11/2023    8:48 AM 01/05/2023    3:00 PM 09/16/2022   11:14 AM 09/02/2021   11:52 AM 04/30/2020   11:32 AM 11/21/2018    7:55 AM 09/27/2017   10:50 AM  Advanced Directives  Does Patient Have a Medical Advance Directive? No No No No No No No  Would patient like information on creating a medical advance directive?  No - Patient declined Yes (MAU/Ambulatory/Procedural Areas - Information given) No - Patient declined Yes (MAU/Ambulatory/Procedural Areas - Information given) No - Patient declined     Current Medications (verified) Outpatient Encounter Medications as of 03/11/2023  Medication Sig   Cholecalciferol (VITAMIN D) 50 MCG (2000 UT) CAPS Take 1 capsule by mouth daily.   vitamin E 1000 UNIT capsule Take 1,000 Units by mouth daily.   metoprolol tartrate (LOPRESSOR) 100 MG tablet Take 1 tablet (100 mg total) by mouth once for 1 dose. Please take one time dose '100mg'$  metoprolol tartrate 2 hr prior to cardiac CT for HR control IF HR >55bpm.   No facility-administered encounter medications on file as of 03/11/2023.    Allergies (verified) Penicillins   History: Past Medical History:  Diagnosis Date   Aortic atherosclerosis (HCC)    CAD (coronary artery disease)     Fever blister    Glaucoma    HLD (hyperlipidemia)    LBBB (left bundle branch block) 01/05/2023   Palpitations    PONV (postoperative nausea and vomiting)    Statin myopathy    Past Surgical History:  Procedure Laterality Date   ABDOMINAL HYSTERECTOMY     bladder tack     BUNIONECTOMY Right 09/16/2022   Procedure: LAPIDUS TYPE;  Surgeon: FSamara Deist DPM;  [place] [facility]  Service: Podiatry;  Laterality: Right;   CATARACT EXTRACTION W/ INTRAOCULAR LENS  IMPLANT, BILATERAL     CATARACT EXTRACTION, BILATERAL     COLONOSCOPY WITH PROPOFOL N/A 11/21/2018   Procedure: COLONOSCOPY WITH BIOPSIES;  Surgeon: WLucilla Lame MD;  [place] [facility]  Service: Endoscopy;  Laterality: N/A;   EXCISION PARTIAL PHALANX Right 09/16/2022   Procedure: EXCISION PARTIAL PHALANX - SECOND;  Surgeon: FSamara Deist DPM;  [place] [facility]  Service: Podiatry;  Laterality: Right;   FRACTURE SURGERY Bilateral    plate left wrist   OOPHORECTOMY     POLYPECTOMY N/A 11/21/2018   Procedure: POLYPECTOMY;  Surgeon: WLucilla Lame MD;  [place] [facility]  Service: Endoscopy;  Laterality: N/A;   TUBAL LIGATION     Family History  Problem Relation Age of Onset   Cirrhosis Mother    Lung cancer Father    Cancer Sister        ovarian   Ovarian cancer Sister    Cirrhosis Sister  COPD Sister    Peripheral Artery Disease Sister    Cancer Sister 48       pancreatic cancer   Cancer Brother        throat and lung   Colon cancer Maternal Aunt    Breast cancer Maternal Aunt        3 mat aunts   Breast cancer Maternal Aunt    Colon cancer Maternal Uncle    Breast cancer Other    Social History   Socioeconomic History   Marital status: Widowed    Spouse name: Not on file   Number of children: 2   Years of education: Not on file   Highest education level: Not on file  Occupational History   Occupation: Regulatory affairs officer   Tobacco Use   Smoking status:  Never   Smokeless tobacco: Never  Vaping Use   Vaping Use: Never used  Substance and Sexual Activity   Alcohol use: No   Drug use: No   Sexual activity: Not Currently  Other Topics Concern   Not on file  Social History Narrative   She lost her second husband March 0000000 from complications of PVD ( they were married for 16 years )   Two children from the first marriage   Lives alone   Social Determinants of Health   Financial Resource Strain: Low Risk  (03/11/2023)   Overall Financial Resource Strain (CARDIA)    Difficulty of Paying Living Expenses: Not hard at all  Food Insecurity: No Food Insecurity (03/11/2023)   Hunger Vital Sign    Worried About Running Out of Food in the Last Year: Never true    [place] in the Last Year: Never true  Transportation Needs: No Transportation Needs (03/11/2023)   PRAPARE - Hydrologist (Medical): No    Lack of Transportation (Non-Medical): No  Physical Activity: Sufficiently Active (03/11/2023)   Exercise Vital Sign    Days of Exercise per Week: 6 days    Minutes of Exercise per Session: 60 min  Stress: No Stress Concern Present (03/11/2023)   [place]    Feeling of Stress : Not at all  Social Connections: Moderately Integrated (03/11/2023)   Social Connection and Isolation Panel [NHANES]    Frequency of Communication with Friends and Family: More than three times a week    Frequency of Social Gatherings with Friends and Family: Twice a week    Attends Religious Services: More than 4 times per year    Active Member of [employer] or Organizations: Yes    Attends Archivist Meetings: More than 4 times per year    Marital Status: Widowed    Tobacco Counseling Counseling given: Not Answered   Clinical Intake:  Pre-visit preparation completed: Yes  Pain : No/denies pain     BMI - recorded: 25.92 Nutritional Status: BMI 25 -29  Overweight Nutritional Risks: None Diabetes: No  How often do you need to have someone help you when you read instructions, pamphlets, or other written materials from your doctor or pharmacy?: 1 - Never  Diabetic?NO  Interpreter Needed?: No  Information entered by :: B.Abhi Moccia,LPN   Activities of Daily Living    03/11/2023    8:49 AM 01/05/2023    3:01 PM  In your present state of health, do you have any difficulty performing the following activities:  Hearing? 0   Vision? 1   Difficulty concentrating  or making decisions? 0   Walking or climbing stairs? 0   Dressing or bathing? 0   Doing errands, shopping? 0 0  Preparing Food and eating ? N   Using the Toilet? N   In the past six months, have you accidently leaked urine? N   Do you have problems with loss of bowel control? N   Managing your Medications? N   Managing your Finances? N   Housekeeping or managing your Housekeeping? N     Patient Care Team: Steele Sizer, MD as PCP - General (Family Medicine) Lorelee Cover., MD (Ophthalmology) Dermatology, Dorthy Cooler, MD as Consulting Physician (Gastroenterology)  Indicate any recent Medical Services you may have received from other than [facility] providers in the past year (date may be approximate).     Assessment:   This is a routine wellness examination for Addy.  Hearing/Vision screen Hearing Screening - Comments:: Adequate hearing Vision Screening - Comments:: Recent retina tear in rt eye-repaired but still blurred Left adequate Dr Gloriann Loan  Dietary issues and exercise activities discussed: Current Exercise Habits: Structured exercise class, Type of exercise: stretching;treadmill;strength training/weights;yoga;walking, Time (Minutes): > 60, Frequency (Times/Week): 6, Weekly Exercise (Minutes/Week): 0, Intensity: Moderate, Exercise limited by: None identified   Goals Addressed   None    Depression Screen    03/11/2023    8:45 AM 12/03/2022    8:05 AM  11/13/2022   10:27 AM 02/23/2022    1:56 PM 01/15/2022    9:10 AM 11/11/2021    2:32 PM 09/02/2021   11:48 AM  PHQ 2/9 Scores  PHQ - 2 Score 0 0 0 2 0 0 0  PHQ- 9 Score  0 0 2 0 0     Fall Risk    03/11/2023    8:42 AM 12/03/2022    8:04 AM 11/13/2022   10:27 AM 02/23/2022    1:56 PM 01/15/2022    9:10 AM  [place] in the past year? 0 0 0 0 0  Number falls in past yr: 0 0  0 0  Injury with Fall? 0 0  0 0  Risk for fall due to : No Fall Risks No Fall Risks No Fall Risks No Fall Risks No Fall Risks  Follow up Education provided;Falls prevention discussed Falls prevention discussed Falls prevention discussed;Education provided;Falls evaluation completed Falls prevention discussed Falls prevention discussed    FALL RISK PREVENTION PERTAINING TO THE HOME:  Any stairs in or around the home? Yes  If so, are there any without handrails? Yes  Home free of loose throw rugs in walkways, pet beds, electrical cords, etc? Yes  Adequate lighting in your home to reduce risk of falls? Yes   ASSISTIVE DEVICES UTILIZED TO PREVENT FALLS:  Life alert? No  Use of a cane, walker or w/c? No  Grab bars in the bathroom? No  Shower chair or bench in shower? No  Elevated toilet seat or a handicapped toilet? No    Cognitive Function:        03/11/2023    8:50 AM  6CIT Screen  What Year? 0 points  What month? 0 points  What time? 0 points  Count back from 20 0 points  Months in reverse 0 points  Repeat phrase 0 points  Total Score 0 points    Immunizations Immunization History  Administered Date(s) Administered   Influenza, High Dose Seasonal PF 11/11/2021   Influenza,inj,Quad PF,6+ Mos 09/16/2016, 10/06/2018   PFIZER(Purple Top)SARS-COV-2 Vaccination  02/12/2020, 03/04/2020   PNEUMOCOCCAL CONJUGATE-20 08/20/2021   Pneumococcal Conjugate-13 04/11/2020   Tdap 09/16/2016   Zoster, Live 10/13/2016    TDAP status: Up to date  Flu Vaccine status: Up to date  Pneumococcal vaccine  status: Up to date  Covid-19 vaccine status: Completed vaccines  Qualifies for Shingles Vaccine? Yes   Zostavax completed No   Shingrix Completed?: No.    Education has been provided regarding the importance of this vaccine. Patient has been advised to call insurance company to determine out of pocket expense if they have not yet received this vaccine. Advised may also receive vaccine at local pharmacy or Health Dept. Verbalized acceptance and understanding.  Screening Tests Health Maintenance  Topic Date Due   Zoster Vaccines- Shingrix (1 of 2) Never done   COVID-19 Vaccine (3 - Pfizer risk series) 04/01/2020   INFLUENZA VACCINE  03/28/2023 (Originally 07/28/2022)   DEXA SCAN  10/17/2023   COLONOSCOPY (Pts 45-18yr Insurance coverage will need to be confirmed)  11/22/2023   Medicare Annual Wellness (AWV)  03/10/2024   MAMMOGRAM  10/19/2024   DTaP/Tdap/Td (2 - Td or Tdap) 09/16/2026   Pneumonia Vaccine 70 Years old  Completed   Hepatitis C Screening  Completed   HPV VACCINES  Aged Out    Health Maintenance  Health Maintenance Due  Topic Date Due   Zoster Vaccines- Shingrix (1 of 2) Never done   COVID-19 Vaccine (3 - Pfizer risk series) 04/01/2020    Colorectal cancer screening: Type of screening: Colonoscopy. Completed YES. Repeat every 10 years  Mammogram status: Completed YES. Repeat every year  Bone Density status: Completed YES. Results reflect: Bone density results: OSTEOPOROSIS. Repeat every 3 years.  Lung Cancer Screening: (Low Dose CT Chest recommended if Age 70-80years, 30 pack-year currently smoking OR have quit w/in 15years.) does not qualify.   Lung Cancer Screening Referral: NO  Additional Screening:  Hepatitis C Screening: does not qualify; Completed YES  Vision Screening: Recommended annual ophthalmology exams for early detection of glaucoma and other disorders of the eye. Is the patient up to date with their annual eye exam?  Yes  Who is the provider or  what is the name of the office in which the patient attends annual eye exams? Dr BGloriann LoanIf pt is not established with a provider, would they like to be referred to a provider to establish care? No .   Dental Screening: Recommended annual dental exams for proper oral hygiene  Community Resource Referral / Chronic Care Management: CRR required this visit?  No   CCM required this visit?  No      Plan:     I have personally reviewed and noted the following in the patient's chart:   Medical and social history Use of alcohol, tobacco or illicit drugs  Current medications and supplements including opioid prescriptions. Patient is not currently taking opioid prescriptions. Functional ability and status Nutritional status Physical activity Advanced directives List of other physicians Hospitalizations, surgeries, and ER visits in previous 12 months Vitals Screenings to include cognitive, depression, and falls Referrals and appointments  In addition, I have reviewed and discussed with patient certain preventive protocols, quality metrics, and best practice recommendations. A written personalized care plan for preventive services as well as general preventive health recommendations were provided to patient.     BR[facility]er [facility]ter LPN   [id number]  Nurse Notes: pt is doing well. [facility] She has no questions or  concerns during the visit.

## 2023-03-18 DIAGNOSIS — H40153 Residual stage of open-angle glaucoma, bilateral: Secondary | ICD-10-CM | POA: Diagnosis not present

## 2023-04-19 DIAGNOSIS — E7800 Pure hypercholesterolemia, unspecified: Secondary | ICD-10-CM | POA: Diagnosis not present

## 2023-04-19 DIAGNOSIS — I700 Atherosclerosis of aorta: Secondary | ICD-10-CM | POA: Diagnosis not present

## 2023-04-19 DIAGNOSIS — I2510 Atherosclerotic heart disease of native coronary artery without angina pectoris: Secondary | ICD-10-CM | POA: Diagnosis not present

## 2023-04-19 DIAGNOSIS — I447 Left bundle-branch block, unspecified: Secondary | ICD-10-CM | POA: Diagnosis not present

## 2023-05-27 DIAGNOSIS — H59811 Chorioretinal scars after surgery for detachment, right eye: Secondary | ICD-10-CM | POA: Diagnosis not present

## 2023-05-27 DIAGNOSIS — H43813 Vitreous degeneration, bilateral: Secondary | ICD-10-CM | POA: Diagnosis not present

## 2023-05-27 DIAGNOSIS — H35433 Paving stone degeneration of retina, bilateral: Secondary | ICD-10-CM | POA: Diagnosis not present

## 2023-05-27 DIAGNOSIS — H43391 Other vitreous opacities, right eye: Secondary | ICD-10-CM | POA: Diagnosis not present

## 2023-05-27 DIAGNOSIS — H2513 Age-related nuclear cataract, bilateral: Secondary | ICD-10-CM | POA: Diagnosis not present

## 2023-06-14 ENCOUNTER — Ambulatory Visit: Payer: Medicare HMO | Admitting: Family Medicine

## 2023-06-18 DIAGNOSIS — H33311 Horseshoe tear of retina without detachment, right eye: Secondary | ICD-10-CM | POA: Diagnosis not present

## 2023-06-18 DIAGNOSIS — H26491 Other secondary cataract, right eye: Secondary | ICD-10-CM | POA: Diagnosis not present

## 2023-06-18 DIAGNOSIS — H43391 Other vitreous opacities, right eye: Secondary | ICD-10-CM | POA: Diagnosis not present

## 2023-07-08 DIAGNOSIS — H524 Presbyopia: Secondary | ICD-10-CM | POA: Diagnosis not present

## 2023-07-08 DIAGNOSIS — H40153 Residual stage of open-angle glaucoma, bilateral: Secondary | ICD-10-CM | POA: Diagnosis not present

## 2023-09-24 ENCOUNTER — Encounter: Payer: Self-pay | Admitting: Nurse Practitioner

## 2023-09-24 ENCOUNTER — Other Ambulatory Visit: Payer: Self-pay

## 2023-09-24 ENCOUNTER — Telehealth (INDEPENDENT_AMBULATORY_CARE_PROVIDER_SITE_OTHER): Payer: Medicare HMO | Admitting: Nurse Practitioner

## 2023-09-24 VITALS — Ht 64.0 in | Wt 151.0 lb

## 2023-09-24 DIAGNOSIS — R051 Acute cough: Secondary | ICD-10-CM | POA: Diagnosis not present

## 2023-09-24 MED ORDER — PROMETHAZINE-DM 6.25-15 MG/5ML PO SYRP: 5.0000 mL | ORAL_SOLUTION | Freq: Four times a day (QID) | ORAL | 0 refills | Status: DC | PRN

## 2023-09-24 MED ORDER — ALBUTEROL SULFATE HFA 108 (90 BASE) MCG/ACT IN AERS: 2.0000 | INHALATION_SPRAY | Freq: Four times a day (QID) | RESPIRATORY_TRACT | 0 refills | Status: DC | PRN

## 2023-09-24 MED ORDER — BENZONATATE 100 MG PO CAPS: 200.0000 mg | ORAL_CAPSULE | Freq: Two times a day (BID) | ORAL | 0 refills | Status: DC | PRN

## 2023-09-24 NOTE — Progress Notes (Signed)
Name: Ashlee Mccarthy   MRN: [id number]    DOB: [birth year]   Date:09/24/2023       Progress Note  Subjective  Chief Complaint  Chief Complaint  Patient presents with   URI    Cough, congestion, for 3 weeks    I connected with  Abran Duke  on 09/24/23 at  1:00 PM EDT by a video enabled telemedicine application and verified that I am speaking with the correct person using two identifiers.  I discussed the limitations of evaluation and management by telemedicine and the availability of in person appointments. The patient expressed understanding and agreed to proceed with a virtual visit  Staff also discussed with the patient that there may be a patient responsible charge related to this service. Patient Location: home Provider Location: [facility] Additional Individuals present: alone  HPI   URI Compliant: symptoms started three weeks ago -Fever: no -Cough: yes -Shortness of breath: yes, occasionally -Wheezing: yes -Chest congestion: yes -Nasal congestion: yes -Runny nose: yes -Post nasal drip: no -Sore throat: no -Sinus pressure: no -Headache: no -Face pain: no -Ear pain:  no -Ear pressure: no -Relief with OTC cold/cough medications: has not taken anything  Recommend taking zyrtec, flonase, mucinex, vitamin d, vitamin c, and zinc. Push fluids and get rest.    Will send in tessalon perls, albuterol and phenergan DM, if no improvement by Monday will get chest xray.   Patient Active Problem List   Diagnosis Date Noted   Statin myopathy 12/03/2022   Atherosclerosis of aorta (HCC) 02/23/2022   Coronary artery disease involving native coronary artery of native heart without angina pectoris 02/23/2022   Microalbuminuria 02/23/2022   Pure hypercholesterolemia 02/23/2022   Hallux valgus, acquired, bilateral 04/30/2020   Benign neoplasm of ascending colon    Osteoporosis 11/25/2016   Fever blister 09/16/2016   History of hysterectomy for benign disease 09/16/2016    Menopause syndrome 09/16/2016    Social History   Tobacco Use   Smoking status: Never   Smokeless tobacco: Never  Substance Use Topics   Alcohol use: No     Current Outpatient Medications:    Cholecalciferol (VITAMIN D) 50 MCG (2000 UT) CAPS, Take 1 capsule by mouth daily., Disp: , Rfl:    latanoprost (XALATAN) 0.005 % ophthalmic solution, Place 1 drop into both eyes at bedtime., Disp: , Rfl:    metoprolol succinate (TOPROL-XL) 25 MG 24 hr tablet, Take 25 mg by mouth daily. Patient stated she is taking 1/2 tablet, Disp: , Rfl:    metoprolol tartrate (LOPRESSOR) 100 MG tablet, Take 1 tablet (100 mg total) by mouth once for 1 dose. Please take one time dose 100mg  metoprolol tartrate 2 hr prior to cardiac CT for HR control IF HR >55bpm., Disp: 1 tablet, Rfl: 0   vitamin E 1000 UNIT capsule, Take 1,000 Units by mouth daily., Disp: , Rfl:   Allergies  Allergen Reactions   Penicillins Rash    I personally reviewed active problem list, medication list, allergies with the patient/caregiver today.  ROS  Ten systems reviewed and is negative except as mentioned in HPI    Objective  Virtual encounter, vitals not obtained.  Body mass index is 25.92 kg/m.  Nursing Note and Vital Signs reviewed.  Physical Exam  Awake, alert and oriented, speaking in complete sentences  No results found for this or any previous visit (from the past 72 hour(s)).  Assessment & Plan  1. Acute cough Recommend taking zyrtec, flonase,  mucinex, vitamin d, vitamin c, and zinc. Push fluids and get rest.    Will send in tessalon perls, albuterol and phenergan DM, if no improvement by Monday will get chest xray.  - albuterol (VENTOLIN HFA) 108 (90 Base) MCG/ACT inhaler; Inhale 2 puffs into the lungs every 6 (six) hours as needed for wheezing or shortness of breath.  Dispense: 8 g; Refill: 0 - benzonatate (TESSALON) 100 MG capsule; Take 2 capsules (200 mg total) by mouth 2 (two) times daily as needed for  cough.  Dispense: 20 capsule; Refill: 0 - promethazine-dextromethorphan (PROMETHAZINE-DM) 6.25-15 MG/5ML syrup; Take 5 mLs by mouth 4 (four) times daily as needed for cough.  Dispense: 118 mL; Refill: 0   -Red flags and when to present for emergency care or RTC including fever >101.13F, chest pain, shortness of breath, new/worsening/un-resolving symptoms,  reviewed with patient at time of visit. Follow up and care instructions discussed and provided in AVS. - I discussed the assessment and treatment plan with the patient. The patient was provided an opportunity to ask questions and all were answered. The patient agreed with the plan and demonstrated an understanding of the instructions.  I provided 15 minutes of non-face-to-face time during this encounter.  Berniece Salines, FNP

## 2023-10-06 ENCOUNTER — Other Ambulatory Visit: Payer: Self-pay | Admitting: Family Medicine

## 2023-10-06 DIAGNOSIS — Z1231 Encounter for screening mammogram for malignant neoplasm of breast: Secondary | ICD-10-CM

## 2023-10-11 ENCOUNTER — Encounter: Payer: Self-pay | Admitting: Family Medicine

## 2023-10-11 ENCOUNTER — Ambulatory Visit: Payer: Medicare HMO | Admitting: Family Medicine

## 2023-10-11 VITALS — BP 120/66 | HR 88 | Temp 98.0°F | Resp 14 | Ht 64.0 in | Wt 146.9 lb

## 2023-10-11 DIAGNOSIS — R059 Cough, unspecified: Secondary | ICD-10-CM

## 2023-10-11 MED ORDER — AZITHROMYCIN 250 MG PO TABS: ORAL_TABLET | ORAL | 0 refills | Status: AC

## 2023-10-11 MED ORDER — GUAIFENESIN ER 600 MG PO TB12: 600.0000 mg | ORAL_TABLET | Freq: Two times a day (BID) | ORAL | 0 refills | Status: DC

## 2023-10-11 MED ORDER — TRELEGY ELLIPTA 100-62.5-25 MCG/ACT IN AEPB: 1.0000 | INHALATION_SPRAY | Freq: Every day | RESPIRATORY_TRACT | Status: DC

## 2023-10-11 NOTE — Progress Notes (Signed)
Name: Ashlee Mccarthy   MRN: [id number]    DOB: [birth year]   Date:10/11/2023       Progress Note  Subjective  Chief Complaint  Cough/ Congestion  HPI  Cough: she was seen virtually by Della Goo and was given Tessalon perles and albuterol, she states she continues to cough, feels wet, some wheezing but no fever, she has normal appetite and no nausea or vomiting.    Patient Active Problem List   Diagnosis Date Noted   Statin myopathy 12/03/2022   Atherosclerosis of aorta (HCC) 02/23/2022   Coronary artery disease involving native coronary artery of native heart without angina pectoris 02/23/2022   Microalbuminuria 02/23/2022   Pure hypercholesterolemia 02/23/2022   Hallux valgus, acquired, bilateral 04/30/2020   Benign neoplasm of ascending colon    Osteoporosis 11/25/2016   Fever blister 09/16/2016   History of hysterectomy for benign disease 09/16/2016   Menopause syndrome 09/16/2016    Past Surgical History:  Procedure Laterality Date   ABDOMINAL HYSTERECTOMY     bladder tack     BUNIONECTOMY Right 09/16/2022   Procedure: LAPIDUS TYPE;  Surgeon: Gwyneth Revels, DPM;  Location: [facility];  Service: Podiatry;  Laterality: Right;   CATARACT EXTRACTION W/ INTRAOCULAR LENS  IMPLANT, BILATERAL     CATARACT EXTRACTION, BILATERAL     COLONOSCOPY WITH PROPOFOL N/A 11/21/2018   Procedure: COLONOSCOPY WITH BIOPSIES;  Surgeon: Midge Minium, MD;  Location: [facility];  Service: Endoscopy;  Laterality: N/A;   EXCISION PARTIAL PHALANX Right 09/16/2022   Procedure: EXCISION PARTIAL PHALANX - SECOND;  Surgeon: Gwyneth Revels, DPM;  Location: [facility];  Service: Podiatry;  Laterality: Right;   FRACTURE SURGERY Bilateral    plate left wrist   OOPHORECTOMY     POLYPECTOMY N/A 11/21/2018   Procedure: POLYPECTOMY;  Surgeon: Midge Minium, MD;  Location: [facility];  Service: Endoscopy;  Laterality: N/A;   TUBAL LIGATION      Family History   Problem Relation Age of Onset   Cirrhosis Mother    Lung cancer Father    Cancer Sister        ovarian   Ovarian cancer Sister    Cirrhosis Sister    COPD Sister    Peripheral Artery Disease Sister    Cancer Sister 39       pancreatic cancer   Cancer Brother        throat and lung   Colon cancer Maternal Aunt    Breast cancer Maternal Aunt        3 mat aunts   Breast cancer Maternal Aunt    Colon cancer Maternal Uncle    Breast cancer Other     Social History   Tobacco Use   Smoking status: Never   Smokeless tobacco: Never  Substance Use Topics   Alcohol use: No     Current Outpatient Medications:    albuterol (VENTOLIN HFA) 108 (90 Base) MCG/ACT inhaler, Inhale 2 puffs into the lungs every 6 (six) hours as needed for wheezing or shortness of breath., Disp: 8 g, Rfl: 0   azithromycin (ZITHROMAX) 250 MG tablet, Take 2 tablets on day 1, then 1 tablet daily on days 2 through 5, Disp: 6 tablet, Rfl: 0   benzonatate (TESSALON) 100 MG capsule, Take 2 capsules (200 mg total) by mouth 2 (two) times daily as needed for cough., Disp: 20 capsule, Rfl: 0   Cholecalciferol (VITAMIN D) 50 MCG (2000 UT) CAPS, Take 1 capsule by mouth  daily., Disp: , Rfl:    Fluticasone-Umeclidin-Vilant (TRELEGY ELLIPTA) 100-62.5-25 MCG/ACT AEPB, Inhale 1 puff into the lungs daily., Disp: , Rfl:    guaiFENesin (MUCINEX) 600 MG 12 hr tablet, Take 1 tablet (600 mg total) by mouth 2 (two) times daily., Disp: 20 tablet, Rfl: 0   latanoprost (XALATAN) 0.005 % ophthalmic solution, Place 1 drop into both eyes at bedtime., Disp: , Rfl:    metoprolol succinate (TOPROL-XL) 25 MG 24 hr tablet, Take 25 mg by mouth daily. Patient stated she is taking 1/2 tablet, Disp: , Rfl:    promethazine-dextromethorphan (PROMETHAZINE-DM) 6.25-15 MG/5ML syrup, Take 5 mLs by mouth 4 (four) times daily as needed for cough., Disp: 118 mL, Rfl: 0   metoprolol tartrate (LOPRESSOR) 100 MG tablet, Take 1 tablet (100 mg total) by mouth once  for 1 dose. Please take one time dose 100mg  metoprolol tartrate 2 hr prior to cardiac CT for HR control IF HR >55bpm., Disp: 1 tablet, Rfl: 0  Allergies  Allergen Reactions   Pravastatin     Other Reaction(s): Muscle Pain   Penicillins Rash    I personally reviewed active problem list, medication list, allergies, family history, social history, health maintenance with the patient/caregiver today.   ROS  Ten systems reviewed and is negative except as mentioned in HPI    Objective  Vitals:   10/11/23 1349  BP: 120/66  Pulse: 88  Resp: 14  Temp: 98 F (36.7 C)  TempSrc: Oral  SpO2: 97%  Weight: 146 lb 14.4 oz (66.6 kg)  Height: 5\' 4"  (1.626 m)    Body mass index is 25.22 kg/m.  Physical Exam  Constitutional: Patient appears well-developed and well-nourished. Obese  No distress.  HEENT: head atraumatic, normocephalic, pupils equal and reactive to light, normal TM, neck supple, throat within normal limits Cardiovascular: Normal rate, regular rhythm and normal heart sounds.  No murmur heard. No BLE edema. Pulmonary/Chest: Effort normal , rhonchi bilaterally.  No respiratory distress. Abdominal: Soft.  There is no tenderness. Psychiatric: Patient has a normal mood and affect. behavior is normal. Judgment and thought content normal.   PHQ2/9:    10/11/2023    1:51 PM 09/24/2023    8:51 AM 03/11/2023    8:45 AM 12/03/2022    8:05 AM 11/13/2022   10:27 AM  Depression screen PHQ 2/9  Decreased Interest 0 0 0 0 0  Down, Depressed, Hopeless 0 0 0 0 0  PHQ - 2 Score 0 0 0 0 0  Altered sleeping 0   0 0  Tired, decreased energy 0   0 0  Change in appetite 0   0 0  Feeling bad or failure about yourself  0   0 0  Trouble concentrating 0   0 0  Moving slowly or fidgety/restless 0   0 0  Suicidal thoughts 0   0 0  PHQ-9 Score 0   0 0    phq 9 is negative   Fall Risk:    10/11/2023    1:51 PM 09/24/2023    8:51 AM 03/11/2023    8:42 AM 12/03/2022    8:04 AM 11/13/2022    10:27 AM  Fall Risk   Falls in the past year? 0 0 0 0 0  Number falls in past yr:  0 0 0   Injury with Fall?  0 0 0   Risk for fall due to : No Fall Risks No Fall Risks No Fall Risks No Fall Risks No  Fall Risks  Follow up Falls prevention discussed Falls prevention discussed Education provided;Falls prevention discussed Falls prevention discussed Falls prevention discussed;Education provided;Falls evaluation completed    Assessment & Plan  1. Cough in adult  - guaiFENesin (MUCINEX) 600 MG 12 hr tablet; Take 1 tablet (600 mg total) by mouth 2 (two) times daily.  Dispense: 20 tablet; Refill: 0 - Fluticasone-Umeclidin-Vilant (TRELEGY ELLIPTA) 100-62.5-25 MCG/ACT AEPB; Inhale 1 puff into the lungs daily. - azithromycin (ZITHROMAX) 250 MG tablet; Take 2 tablets on day 1, then 1 tablet daily on days 2 through 5  Dispense: 6 tablet; Refill: 0

## 2023-10-21 ENCOUNTER — Other Ambulatory Visit: Payer: Self-pay | Admitting: Nurse Practitioner

## 2023-10-21 DIAGNOSIS — R051 Acute cough: Secondary | ICD-10-CM

## 2023-10-22 NOTE — Telephone Encounter (Signed)
Requested Prescriptions  Pending Prescriptions Disp Refills   albuterol (VENTOLIN HFA) 108 (90 Base) MCG/ACT inhaler [Pharmacy Med Name: ALBUTEROL HFA (VENTOLIN) INH] 18 each 2    Sig: TAKE 2 PUFFS BY MOUTH EVERY 6 HOURS AS NEEDED FOR WHEEZE OR SHORTNESS OF BREATH     Pulmonology:  Beta Agonists 2 Passed - 10/21/2023  2:46 AM      Passed - Last BP in normal range    BP Readings from Last 1 Encounters:  10/11/23 120/66         Passed - Last Heart Rate in normal range    Pulse Readings from Last 1 Encounters:  10/11/23 88         Passed - Valid encounter within last 12 months    Recent Outpatient Visits           1 week ago Cough in adult   [facility] Alba Cory, MD   4 weeks ago Acute cough   [facility] Berniece Salines, FNP   10 months ago Well adult exam   [facility] Alba Cory, MD   11 months ago Eye exam abnormal   [facility] Alba Cory, MD   1 year ago Pure hypercholesterolemia   [facility] Alba Cory, MD       Future Appointments             In 1 month Carlynn Purl, Danna Hefty, MD [facility]

## 2023-10-28 ENCOUNTER — Ambulatory Visit
Admission: RE | Admit: 2023-10-28 | Discharge: 2023-10-28 | Disposition: A | Discharge status: Home / Self Care | Payer: Medicare HMO | Source: Ambulatory Visit | Attending: Family Medicine | Admitting: Family Medicine

## 2023-10-28 DIAGNOSIS — I700 Atherosclerosis of aorta: Secondary | ICD-10-CM | POA: Diagnosis not present

## 2023-10-28 DIAGNOSIS — Z1231 Encounter for screening mammogram for malignant neoplasm of breast: Secondary | ICD-10-CM | POA: Diagnosis not present

## 2023-10-28 DIAGNOSIS — I493 Ventricular premature depolarization: Secondary | ICD-10-CM | POA: Diagnosis not present

## 2023-10-28 DIAGNOSIS — I447 Left bundle-branch block, unspecified: Secondary | ICD-10-CM | POA: Diagnosis not present

## 2023-10-28 DIAGNOSIS — E7800 Pure hypercholesterolemia, unspecified: Secondary | ICD-10-CM | POA: Diagnosis not present

## 2023-11-17 DIAGNOSIS — H40153 Residual stage of open-angle glaucoma, bilateral: Secondary | ICD-10-CM | POA: Diagnosis not present

## 2023-11-22 ENCOUNTER — Ambulatory Visit: Payer: Self-pay | Admitting: *Deleted

## 2023-11-22 NOTE — Telephone Encounter (Signed)
   Chief Complaint: Medication request- cough persistent Symptoms: cough- patient states she was doing good until she seemed to get a cold- nasal congestion and the cough restarted and she seems to be back where she started. Frequency: this has been going on for 2 months- she was getting better- but now she feels worse Pertinent Negatives: Patient denies fever, SOB Disposition: [] ED /[] Urgent Care (no appt availability in office) / [] Appointment(In office/virtual)/ []  [facility]/ [] Home Care/ [] Refused Recommended Disposition /[] [facility]/ [x]  Follow-up with PCP Additional Notes: Patient would like to try another antibiotic- she feels she need sit- she will come in if needs- but she feels she is just taking space that someone else could use- she has been in twice for this.    Reason for Disposition  Cough has been present for > 3 weeks  Answer Assessment - Initial Assessment Questions 1. ONSET: "When did the cough begin?"      2 months 2. SEVERITY: "How bad is the cough today?"      Chronic cough- gets better and then comes back 3. SPUTUM: "Describe the color of your sputum" (none, dry cough; clear, white, yellow, green)     Green/yellow- not a lot 4. HEMOPTYSIS: "Are you coughing up any blood?" If so ask: "How much?" (flecks, streaks, tablespoons, etc.)     no 5. DIFFICULTY BREATHING: "Are you having difficulty breathing?" If Yes, ask: "How bad is it?" (e.g., mild, moderate, severe)    - MILD: No SOB at rest, mild SOB with walking, speaks normally in sentences, can lie down, no retractions, pulse < 100.    - MODERATE: SOB at rest, SOB with minimal exertion and prefers to sit, cannot lie down flat, speaks in phrases, mild retractions, audible wheezing, pulse 100-120.    - SEVERE: Very SOB at rest, speaks in single words, struggling to breathe, sitting hunched forward, retractions, pulse > 120      Not trouble breathing- congestion causes SOB- but otherwise ok 6.  FEVER: "Do you have a fever?" If Yes, ask: "What is your temperature, how was it measured, and when did it start?"     no  10. OTHER SYMPTOMS: "Do you have any other symptoms?" (e.g., runny nose, wheezing, chest pain)       Nasal congestion, chest congestion- mostly cough- won't stop  Protocols used: Cough - Acute Productive-A-AH

## 2023-11-22 NOTE — Telephone Encounter (Signed)
Summary: Rx request - z pack   Pt saw Dr. Carlynn Purl a few weeks ago for cold and was given a z pack. Pt requesting another z pack. Pt states she has a lot of congestion and a cough that she can't get rid of. Pt states it comes and goes; she has some good days and then bad days.

## 2023-11-22 NOTE — Telephone Encounter (Signed)
Pt has an appt on 11/23/23 with Raynelle Fanning

## 2023-11-23 ENCOUNTER — Encounter: Payer: Self-pay | Admitting: Nurse Practitioner

## 2023-11-23 ENCOUNTER — Ambulatory Visit (INDEPENDENT_AMBULATORY_CARE_PROVIDER_SITE_OTHER): Payer: Medicare HMO | Admitting: Nurse Practitioner

## 2023-11-23 ENCOUNTER — Ambulatory Visit
Admission: RE | Admit: 2023-11-23 | Discharge: 2023-11-23 | Disposition: A | Discharge status: Home / Self Care | Payer: Medicare HMO | Attending: Nurse Practitioner | Admitting: Nurse Practitioner

## 2023-11-23 ENCOUNTER — Other Ambulatory Visit: Payer: Self-pay

## 2023-11-23 ENCOUNTER — Ambulatory Visit
Admission: RE | Admit: 2023-11-23 | Discharge: 2023-11-23 | Disposition: A | Discharge status: Home / Self Care | Payer: Medicare HMO | Source: Ambulatory Visit | Attending: Nurse Practitioner

## 2023-11-23 VITALS — BP 122/84 | HR 90 | Temp 97.9°F | Resp 16 | Ht 64.0 in | Wt 146.2 lb

## 2023-11-23 DIAGNOSIS — J479 Bronchiectasis, uncomplicated: Secondary | ICD-10-CM | POA: Diagnosis not present

## 2023-11-23 DIAGNOSIS — R053 Chronic cough: Secondary | ICD-10-CM

## 2023-11-23 DIAGNOSIS — R059 Cough, unspecified: Secondary | ICD-10-CM | POA: Diagnosis not present

## 2023-11-23 DIAGNOSIS — J40 Bronchitis, not specified as acute or chronic: Secondary | ICD-10-CM | POA: Diagnosis not present

## 2023-11-23 DIAGNOSIS — R918 Other nonspecific abnormal finding of lung field: Secondary | ICD-10-CM | POA: Diagnosis not present

## 2023-11-23 MED ORDER — PROMETHAZINE-DM 6.25-15 MG/5ML PO SYRP: 5.0000 mL | ORAL_SOLUTION | Freq: Four times a day (QID) | ORAL | 0 refills | Status: DC | PRN

## 2023-11-23 MED ORDER — PREDNISONE 10 MG (21) PO TBPK: ORAL_TABLET | ORAL | 0 refills | Status: DC

## 2023-11-23 MED ORDER — BENZONATATE 100 MG PO CAPS: 200.0000 mg | ORAL_CAPSULE | Freq: Two times a day (BID) | ORAL | 0 refills | Status: DC | PRN

## 2023-11-23 MED ORDER — TRELEGY ELLIPTA 100-62.5-25 MCG/ACT IN AEPB: 1.0000 | INHALATION_SPRAY | Freq: Every day | RESPIRATORY_TRACT | Status: DC

## 2023-11-23 NOTE — Progress Notes (Signed)
BP 122/84   Pulse 90   Temp 97.9 F (36.6 C) (Oral)   Resp 16   Ht 5\' 4"  (1.626 m)   Wt 146 lb 3.2 oz (66.3 kg)   SpO2 98%   BMI 25.10 kg/m    Subjective:    Patient ID: Ashlee Mccarthy, female    DOB: 16-Mar-1951, 72 y.o.   MRN: [id number]  HPI: Ashlee Mccarthy is a 72 y.o. female  Chief Complaint  Patient presents with   Bronchitis    Follow up, still have sob, cough on and off  for 5 weeks   Seen by me on 09/24/2023 for URI, seen by Dr. Carlynn Purl on 10/11/2023 for continued cough.   Treated with :  guaiFENesin (MUCINEX) 600 MG 12 hr tablet; Take 1 tablet (600 mg total) by mouth 2 (two) times daily.  Dispense: 20 tablet; Refill: 0 - Fluticasone-Umeclidin-Vilant (TRELEGY ELLIPTA) 100-62.5-25 MCG/ACT AEPB; Inhale 1 puff into the lungs daily. - azithromycin (ZITHROMAX) 250 MG tablet; Take 2 tablets on day 1, then 1 tablet daily on days 2 through 5  Dispense: 6 tablet; Refill: 0  Discussed the use of AI scribe software for clinical note transcription with the patient, who gave verbal consent to proceed.  History of Present Illness   The patient, with a history of respiratory symptoms, presents with a persistent cough and congestion. They report feeling unwell, with symptoms worsening over time. They describe an effort to breathe, which has been ongoing for a while. They were previously seen by another doctor in September, who prescribed a Z-Pak, Trelegy, and cough syrup. The patient reported feeling better after a week of treatment, but symptoms returned with the onset of cold weather. Denies any chest pain, nausea, vomiting, fever, nasal congestion.  The patient has run out of the Trelegy sample and the cough syrup. They do not recall receiving or taking any small pills for the cough. They deny any fever but report occasional hot flashes. They express frustration with the persistent symptoms and the impact on their quality of life.    Relevant past medical, surgical, family and  social history reviewed and updated as indicated. Interim medical history since our last visit reviewed. Allergies and medications reviewed and updated.  Review of Systems  Ten systems reviewed and is negative except as mentioned in HPI       Objective:    BP 122/84   Pulse 90   Temp 97.9 F (36.6 C) (Oral)   Resp 16   Ht 5\' 4"  (1.626 m)   Wt 146 lb 3.2 oz (66.3 kg)   SpO2 98%   BMI 25.10 kg/m   Wt Readings from Last 3 Encounters:  11/23/23 146 lb 3.2 oz (66.3 kg)  10/11/23 146 lb 14.4 oz (66.6 kg)  09/24/23 151 lb (68.5 kg)    Physical Exam  Constitutional: Patient appears well-developed and well-nourished.  No distress.  HEENT: head atraumatic, normocephalic, pupils equal and reactive to light, ears TMs clear, neck supple, throat within normal limits Cardiovascular: Normal rate, regular rhythm and normal heart sounds.  No murmur heard. No BLE edema. Pulmonary/Chest: Effort normal and breath sounds slight wheezing. No respiratory distress. Abdominal: Soft.  There is no tenderness. Psychiatric: Patient has a normal mood and affect. behavior is normal. Judgment and thought content normal.  Results for orders placed or performed in visit on 11/13/22  Lipid panel  Result Value Ref Range   Cholesterol 192 <200 mg/dL   HDL 62 >  OR = 50 mg/dL   Triglycerides 50 <161 mg/dL   LDL Cholesterol (Calc) 116 (H) mg/dL (calc)   Total CHOL/HDL Ratio 3.1 <5.0 (calc)   Non-HDL Cholesterol (Calc) 130 (H) <130 mg/dL (calc)  COMPLETE METABOLIC PANEL WITH GFR  Result Value Ref Range   Glucose, Bld 88 65 - 99 mg/dL   BUN 12 7 - 25 mg/dL   Creat 0.96 0.45 - 4.09 mg/dL   eGFR 97 > OR = 60 WJ/XBJ/4.78G9   BUN/Creatinine Ratio SEE NOTE: 6 - 22 (calc)   Sodium 139 135 - 146 mmol/L   Potassium 4.1 3.5 - 5.3 mmol/L   Chloride 106 98 - 110 mmol/L   CO2 24 20 - 32 mmol/L   Calcium 9.5 8.6 - 10.4 mg/dL   Total Protein 6.7 6.1 - 8.1 g/dL   Albumin 4.3 3.6 - 5.1 g/dL   Globulin 2.4 1.9 - 3.7  g/dL (calc)   AG Ratio 1.8 1.0 - 2.5 (calc)   Total Bilirubin 0.6 0.2 - 1.2 mg/dL   Alkaline phosphatase (APISO) 87 37 - 153 U/L   AST 19 10 - 35 U/L   ALT 8 6 - 29 U/L  Microalbumin / creatinine urine ratio  Result Value Ref Range   Creatinine, Urine 6 (L) 20 - 275 mg/dL   Microalb, Ur <5.6 mg/dL   Microalb Creat Ratio NOTE <30 mcg/mg creat      Assessment & Plan:   Problem List Items Addressed This Visit   None Visit Diagnoses     Persistent cough    -  Primary   Relevant Medications   predniSONE (STERAPRED UNI-PAK 21 TAB) 10 MG (21) TBPK tablet   promethazine-dextromethorphan (PROMETHAZINE-DM) 6.25-15 MG/5ML syrup   benzonatate (TESSALON) 100 MG capsule   Fluticasone-Umeclidin-Vilant (TRELEGY ELLIPTA) 100-62.5-25 MCG/ACT AEPB   Other Relevant Orders   DG Chest 2 View       Assessment and Plan    Chronic Cough Persistent cough for 5-6 weeks with prior partial response to Z-Pak and Trelegy. No fever or other systemic symptoms. -Order chest x-ray to rule out any underlying lung pathology. -Start a course of steroids. -Provide another sample of Trelegy. -Prescribe cough syrup and cough pills. -may consider pulmonology referral  General Health Maintenance -Encourage patient to pick up prescriptions at [facility] on the way home. -Review chest x-ray results when available and discuss with Dr. Carlynn Purl if possible.        Follow up plan: Return if symptoms worsen or fail to improve.

## 2023-12-06 ENCOUNTER — Encounter: Payer: Self-pay | Admitting: Nurse Practitioner

## 2023-12-06 ENCOUNTER — Ambulatory Visit (INDEPENDENT_AMBULATORY_CARE_PROVIDER_SITE_OTHER): Payer: Medicare HMO | Admitting: Nurse Practitioner

## 2023-12-06 VITALS — BP 128/82 | HR 86 | Temp 98.1°F | Resp 16 | Ht 64.0 in | Wt 145.6 lb

## 2023-12-06 DIAGNOSIS — Z131 Encounter for screening for diabetes mellitus: Secondary | ICD-10-CM | POA: Diagnosis not present

## 2023-12-06 DIAGNOSIS — Z1211 Encounter for screening for malignant neoplasm of colon: Secondary | ICD-10-CM

## 2023-12-06 DIAGNOSIS — Z780 Asymptomatic menopausal state: Secondary | ICD-10-CM

## 2023-12-06 DIAGNOSIS — I2510 Atherosclerotic heart disease of native coronary artery without angina pectoris: Secondary | ICD-10-CM

## 2023-12-06 DIAGNOSIS — Z130 Encounter for screening for diseases of the blood and blood-forming organs and certain disorders involving the immune mechanism: Secondary | ICD-10-CM | POA: Diagnosis not present

## 2023-12-06 DIAGNOSIS — I700 Atherosclerosis of aorta: Secondary | ICD-10-CM

## 2023-12-06 DIAGNOSIS — Z0000 Encounter for general adult medical examination without abnormal findings: Secondary | ICD-10-CM

## 2023-12-06 DIAGNOSIS — E7800 Pure hypercholesterolemia, unspecified: Secondary | ICD-10-CM

## 2023-12-06 NOTE — Progress Notes (Signed)
Name: Ashlee Mccarthy   MRN: [id number]    DOB: April 11, 1951   Date:12/06/2023       Progress Note  Subjective  Chief Complaint  Chief Complaint  Patient presents with   Annual Exam    HPI  Patient presents for annual CPE.  Diet: well balanced diet Exercise: daily exercise and the [employer]  Sleep: 7 hours Last dental exam:October 2024 Last eye exam: just went  Flowsheet Row Office Visit from 11/23/2023 in [facility]  AUDIT-C Score 0      Depression: Phq 9 is  negative    12/06/2023   10:11 AM 11/23/2023   11:07 AM 10/11/2023    1:51 PM 09/24/2023    8:51 AM 03/11/2023    8:45 AM  Depression screen PHQ 2/9  Decreased Interest 0 0 0 0 0  Down, Depressed, Hopeless 0 0 0 0 0  PHQ - 2 Score 0 0 0 0 0  Altered sleeping 0  0    Tired, decreased energy 0  0    Change in appetite 0  0    Feeling bad or failure about yourself  0  0    Trouble concentrating 0  0    Moving slowly or fidgety/restless 0  0    Suicidal thoughts 0  0    PHQ-9 Score 0  0    Difficult doing work/chores Not difficult at all       Hypertension: BP Readings from Last 3 Encounters:  12/06/23 128/82  11/23/23 122/84  10/11/23 120/66   Obesity: Wt Readings from Last 3 Encounters:  12/06/23 145 lb 9.6 oz (66 kg)  11/23/23 146 lb 3.2 oz (66.3 kg)  10/11/23 146 lb 14.4 oz (66.6 kg)   BMI Readings from Last 3 Encounters:  12/06/23 24.99 kg/m  11/23/23 25.10 kg/m  10/11/23 25.22 kg/m     Vaccines:  HPV: up to at age 63 , ask insurance if age between 65-45  Shingrix: 50-64 yo and ask insurance if covered when patient above 8 yo Pneumonia:  educated and discussed with patient. Flu:  educated and discussed with patient.  Hep C Screening: completed STD testing and prevention (HIV/chl/gon/syphilis): completed Intimate partner violence:none Sexual History :not currently  Menstrual History/LMP/Abnormal Bleeding: hysterectomy Incontinence Symptoms: none  Breast cancer:   - Last Mammogram: 10/28/2023 - BRCA gene screening: none  Osteoporosis: Discussed high calcium and vitamin D supplementation, weight bearing exercises  Cervical cancer screening: hysterectomy   Skin cancer: Discussed monitoring for atypical lesions  Colorectal cancer: due   Lung cancer:   Low Dose CT Chest recommended if Age 35-80 years, 20 pack-year currently smoking OR have quit w/in 15years. Patient does not qualify.   ECG: 06/07/2023  Advanced Care Planning: A voluntary discussion about advance care planning including the explanation and discussion of advance directives.  Discussed health care proxy and Living will, and the patient was able to identify a health care proxy as daughter.  Patient does not have a living will at present time. If patient does have living will, I have requested they bring this to the clinic to be scanned in to their chart.  Lipids: Lab Results  Component Value Date   CHOL 192 11/13/2022   CHOL 185 08/20/2021   CHOL 189 04/11/2020   Lab Results  Component Value Date   HDL 62 11/13/2022   HDL 61 08/20/2021   HDL 65 04/11/2020   Lab Results  Component Value Date   LDLCALC  116 (H) 11/13/2022   LDLCALC 110 (H) 08/20/2021   LDLCALC 111 (H) 04/11/2020   Lab Results  Component Value Date   TRIG 50 11/13/2022   TRIG 59 08/20/2021   TRIG 50 04/11/2020   Lab Results  Component Value Date   CHOLHDL 3.1 11/13/2022   CHOLHDL 3.0 08/20/2021   CHOLHDL 2.9 04/11/2020   No results found for: "LDLDIRECT"  Glucose: Glucose  Date Value Ref Range Status  08/24/2012 90 65 - 99 mg/dL Final   Glucose, Bld  Date Value Ref Range Status  11/13/2022 88 65 - 99 mg/dL Final    Comment:    .            Fasting reference interval .   08/20/2021 78 65 - 99 mg/dL Final    Comment:    .            Fasting reference interval .   04/11/2020 94 65 - 99 mg/dL Final    Comment:    .            Fasting reference interval .     Patient Active Problem  List   Diagnosis Date Noted   Statin myopathy 12/03/2022   Atherosclerosis of aorta (HCC) 02/23/2022   Coronary artery disease involving native coronary artery of native heart without angina pectoris 02/23/2022   Microalbuminuria 02/23/2022   Pure hypercholesterolemia 02/23/2022   Hallux valgus, acquired, bilateral 04/30/2020   Benign neoplasm of ascending colon    Osteoporosis 11/25/2016   Fever blister 09/16/2016   History of hysterectomy for benign disease 09/16/2016   Menopause syndrome 09/16/2016    Past Surgical History:  Procedure Laterality Date   ABDOMINAL HYSTERECTOMY     bladder tack     BUNIONECTOMY Right 09/16/2022   Procedure: LAPIDUS TYPE;  Surgeon: Gwyneth Revels, DPM;  Location: [facility];  Service: Podiatry;  Laterality: Right;   CATARACT EXTRACTION W/ INTRAOCULAR LENS  IMPLANT, BILATERAL     CATARACT EXTRACTION, BILATERAL     COLONOSCOPY WITH PROPOFOL N/A 11/21/2018   Procedure: COLONOSCOPY WITH BIOPSIES;  Surgeon: Midge Minium, MD;  Location: [facility];  Service: Endoscopy;  Laterality: N/A;   EXCISION PARTIAL PHALANX Right 09/16/2022   Procedure: EXCISION PARTIAL PHALANX - SECOND;  Surgeon: Gwyneth Revels, DPM;  Location: [facility];  Service: Podiatry;  Laterality: Right;   EYE SURGERY     Torn retina   FRACTURE SURGERY Bilateral    plate left wrist   OOPHORECTOMY     POLYPECTOMY N/A 11/21/2018   Procedure: POLYPECTOMY;  Surgeon: Midge Minium, MD;  Location: [facility];  Service: Endoscopy;  Laterality: N/A;   TUBAL LIGATION      Family History  Problem Relation Age of Onset   Cirrhosis Mother    Lung cancer Father    Cancer Father    Cancer Sister        ovarian   Ovarian cancer Sister    Kidney disease Sister    Cirrhosis Sister    COPD Sister    Peripheral Artery Disease Sister    Cancer Sister 25       pancreatic cancer   Cancer Brother        throat and lung   Colon cancer Maternal Aunt    Breast  cancer Maternal Aunt        3 mat aunts   Cancer Maternal Aunt    Breast cancer Maternal Aunt    Cancer Maternal Aunt  Colon cancer Maternal Uncle    Breast cancer Other     Social History   Socioeconomic History   Marital status: Widowed    Spouse name: Not on file   Number of children: 2   Years of education: Not on file   Highest education level: Associate degree: academic program  Occupational History   Occupation: Sports administrator   Tobacco Use   Smoking status: Never   Smokeless tobacco: Never  Vaping Use   Vaping status: Never Used  Substance and Sexual Activity   Alcohol use: No   Drug use: No   Sexual activity: Not Currently  Other Topics Concern   Not on file  Social History Narrative   She lost her second husband March 2017 from complications of PVD ( they were married for 16 years )   Two children from the first marriage   Lives alone   Social Determinants of Health   Financial Resource Strain: Low Risk  (12/05/2023)   Overall Financial Resource Strain (CARDIA)    Difficulty of Paying Living Expenses: Not very hard  Food Insecurity: No Food Insecurity (12/05/2023)   Hunger Vital Sign    Worried About Running Out of Food in the Last Year: Never true    Ran Out of Food in the Last Year: Never true  Transportation Needs: No Transportation Needs (12/05/2023)   PRAPARE - Administrator, Civil Service (Medical): No    Lack of Transportation (Non-Medical): No  Physical Activity: Sufficiently Active (12/05/2023)   Exercise Vital Sign    Days of Exercise per Week: 5 days    Minutes of Exercise per Session: 50 min  Stress: No Stress Concern Present (12/05/2023)   [employer] of Occupational Health - Occupational Stress Questionnaire    Feeling of Stress : Not at all  Social Connections: Moderately Integrated (12/05/2023)   Social Connection and Isolation Panel [NHANES]    Frequency of Communication with Friends and Family: More than three times  a week    Frequency of Social Gatherings with Friends and Family: More than three times a week    Attends Religious Services: More than 4 times per year    Active Member of [employer] or Organizations: Yes    Attends Banker Meetings: More than 4 times per year    Marital Status: Widowed  Intimate Partner Violence: Not At Risk (03/11/2023)   Humiliation, Afraid, Rape, and Kick questionnaire    Fear of Current or Ex-Partner: No    Emotionally Abused: No    Physically Abused: No    Sexually Abused: No     Current Outpatient Medications:    Cholecalciferol (VITAMIN D) 50 MCG (2000 UT) CAPS, Take 1 capsule by mouth daily., Disp: , Rfl:    latanoprost (XALATAN) 0.005 % ophthalmic solution, Place 1 drop into both eyes at bedtime., Disp: , Rfl:    metoprolol succinate (TOPROL-XL) 25 MG 24 hr tablet, Take 25 mg by mouth daily. Patient stated she is taking 1/2 tablet, Disp: , Rfl:   Allergies  Allergen Reactions   Pravastatin     Other Reaction(s): Muscle Pain   Penicillins Rash     ROS  Constitutional: Negative for fever or weight change.  Respiratory: Negative for cough and shortness of breath.   Cardiovascular: Negative for chest pain or palpitations.  Gastrointestinal: Negative for abdominal pain, no bowel changes.  Musculoskeletal: Negative for gait problem or joint swelling.  Skin: Negative for rash.  Neurological: Negative for  dizziness or headache.  No other specific complaints in a complete review of systems (except as listed in HPI above).   Objective  Vitals:   12/06/23 1018  BP: 128/82  Pulse: 86  Resp: 16  Temp: 98.1 F (36.7 C)  TempSrc: Oral  SpO2: 99%  Weight: 145 lb 9.6 oz (66 kg)  Height: 5\' 4"  (1.626 m)    Body mass index is 24.99 kg/m.  Physical Exam Constitutional: Patient appears well-developed and well-nourished. No distress.  HENT: Head: Normocephalic and atraumatic. Ears: B TMs ok, no erythema or effusion; Nose: Nose normal.  Mouth/Throat: Oropharynx is clear and moist. No oropharyngeal exudate.  Eyes: Conjunctivae and EOM are normal. Pupils are equal, round, and reactive to light. No scleral icterus.  Neck: Normal range of motion. Neck supple. No JVD present. No thyromegaly present.  Cardiovascular: Normal rate, regular rhythm and normal heart sounds.  No murmur heard. No BLE edema. Pulmonary/Chest: Effort normal and breath sounds normal. No respiratory distress. Abdominal: Soft. Bowel sounds are normal, no distension. There is no tenderness. no masses Breast: no lumps or masses, no nipple discharge or rashes Musculoskeletal: Normal range of motion, no joint effusions. No gross deformities Neurological: he is alert and oriented to person, place, and time. No cranial nerve deficit. Coordination, balance, strength, speech and gait are normal.  Skin: Skin is warm and dry. No rash noted. No erythema.  Psychiatric: Patient has a normal mood and affect. behavior is normal. Judgment and thought content normal.   No results found for this or any previous visit (from the past 2160 hour(s)).    Fall Risk:    12/06/2023   10:11 AM 11/23/2023   11:07 AM 10/11/2023    1:51 PM 09/24/2023    8:51 AM 03/11/2023    8:42 AM  Fall Risk   Falls in the past year? 0 0 0 0 0  Number falls in past yr: 0 0  0 0  Injury with Fall? 0 0  0 0  Risk for fall due to : No Fall Risks  No Fall Risks No Fall Risks No Fall Risks  Follow up Falls prevention discussed;Education provided;Falls evaluation completed  Falls prevention discussed Falls prevention discussed Education provided;Falls prevention discussed     Functional Status Survey: Is the patient deaf or have difficulty hearing?: No Does the patient have difficulty seeing, even when wearing glasses/contacts?: No Does the patient have difficulty concentrating, remembering, or making decisions?: No Does the patient have difficulty walking or climbing stairs?: No Does the patient  have difficulty dressing or bathing?: No Does the patient have difficulty doing errands alone such as visiting a doctor's office or shopping?: No   Assessment & Plan  1. Annual physical exam  - CBC with Differential/Platelet - COMPLETE METABOLIC PANEL WITH GFR - Lipid panel  2. Atherosclerosis of aorta (HCC)  - COMPLETE METABOLIC PANEL WITH GFR - Lipid panel  3. Coronary artery disease involving native coronary artery of native heart without angina pectoris  - COMPLETE METABOLIC PANEL WITH GFR - Lipid panel  4. Pure hypercholesterolemia  - COMPLETE METABOLIC PANEL WITH GFR - Lipid panel  5. Screening for deficiency anemia  - CBC with Differential/Platelet  6. Screening for diabetes mellitus  - COMPLETE METABOLIC PANEL WITH GFR  7. Postmenopausal estrogen deficiency  - DG Bone Density; Future  8. Screening for colon cancer  - Ambulatory referral to Gastroenterology   -USPSTF grade A and B recommendations reviewed with patient; age-appropriate recommendations, preventive care,  screening tests, etc discussed and encouraged; healthy living encouraged; see AVS for patient education given to patient -Discussed importance of 150 minutes of physical activity weekly, eat two servings of fish weekly, eat one serving of tree nuts ( cashews, pistachios, pecans, almonds.Marland Kitchen) every other day, eat 6 servings of fruit/vegetables daily and drink plenty of water and avoid sweet beverages.   -Reviewed Health Maintenance: yes

## 2023-12-07 LAB — CBC WITH DIFFERENTIAL/PLATELET
Absolute Lymphocytes: 2252 {cells}/uL (ref 850–3900)
Absolute Monocytes: 456 {cells}/uL (ref 200–950)
Basophils Absolute: 80 {cells}/uL (ref 0–200)
Basophils Relative: 1.4 %
Eosinophils Absolute: 336 {cells}/uL (ref 15–500)
Eosinophils Relative: 5.9 %
HCT: 42.8 % (ref 35.0–45.0)
Hemoglobin: 13.9 g/dL (ref 11.7–15.5)
MCH: 30.8 pg (ref 27.0–33.0)
MCHC: 32.5 g/dL (ref 32.0–36.0)
MCV: 94.7 fL (ref 80.0–100.0)
MPV: 12 fL (ref 7.5–12.5)
Monocytes Relative: 8 %
Neutro Abs: 2576 {cells}/uL (ref 1500–7800)
Neutrophils Relative %: 45.2 %
Platelets: 268 10*3/uL (ref 140–400)
RBC: 4.52 10*6/uL (ref 3.80–5.10)
RDW: 12.4 % (ref 11.0–15.0)
Total Lymphocyte: 39.5 %
WBC: 5.7 10*3/uL (ref 3.8–10.8)

## 2023-12-07 LAB — COMPLETE METABOLIC PANEL WITH GFR
AG Ratio: 1.8 (calc) (ref 1.0–2.5)
ALT: 13 U/L (ref 6–29)
AST: 22 U/L (ref 10–35)
Albumin: 4.4 g/dL (ref 3.6–5.1)
Alkaline phosphatase (APISO): 62 U/L (ref 37–153)
BUN: 11 mg/dL (ref 7–25)
CO2: 26 mmol/L (ref 20–32)
Calcium: 9.6 mg/dL (ref 8.6–10.4)
Chloride: 107 mmol/L (ref 98–110)
Creat: 0.72 mg/dL (ref 0.60–1.00)
Globulin: 2.4 g/dL (ref 1.9–3.7)
Glucose, Bld: 86 mg/dL (ref 65–99)
Potassium: 4.6 mmol/L (ref 3.5–5.3)
Sodium: 141 mmol/L (ref 135–146)
Total Bilirubin: 0.7 mg/dL (ref 0.2–1.2)
Total Protein: 6.8 g/dL (ref 6.1–8.1)
eGFR: 90 mL/min/{1.73_m2} (ref >=60)

## 2023-12-07 LAB — LIPID PANEL
Cholesterol: 193 mg/dL (ref <200)
HDL: 61 mg/dL (ref >=50)
LDL Cholesterol (Calc): 114 mg/dL — ABNORMAL HIGH
Non-HDL Cholesterol (Calc): 132 mg/dL — ABNORMAL HIGH (ref <130)
Total CHOL/HDL Ratio: 3.2 (calc) (ref <5.0)
Triglycerides: 83 mg/dL (ref <150)

## 2023-12-08 DIAGNOSIS — D485 Neoplasm of uncertain behavior of skin: Secondary | ICD-10-CM | POA: Diagnosis not present

## 2023-12-08 DIAGNOSIS — L821 Other seborrheic keratosis: Secondary | ICD-10-CM | POA: Diagnosis not present

## 2023-12-08 DIAGNOSIS — L570 Actinic keratosis: Secondary | ICD-10-CM | POA: Diagnosis not present

## 2023-12-08 DIAGNOSIS — D2271 Melanocytic nevi of right lower limb, including hip: Secondary | ICD-10-CM | POA: Diagnosis not present

## 2023-12-08 DIAGNOSIS — D2272 Melanocytic nevi of left lower limb, including hip: Secondary | ICD-10-CM | POA: Diagnosis not present

## 2023-12-08 DIAGNOSIS — D225 Melanocytic nevi of trunk: Secondary | ICD-10-CM | POA: Diagnosis not present

## 2023-12-08 DIAGNOSIS — D2261 Melanocytic nevi of right upper limb, including shoulder: Secondary | ICD-10-CM | POA: Diagnosis not present

## 2023-12-08 DIAGNOSIS — D2262 Melanocytic nevi of left upper limb, including shoulder: Secondary | ICD-10-CM | POA: Diagnosis not present

## 2023-12-13 ENCOUNTER — Encounter: Payer: Self-pay | Admitting: *Deleted

## 2023-12-13 ENCOUNTER — Telehealth: Payer: Self-pay

## 2023-12-13 ENCOUNTER — Other Ambulatory Visit: Payer: Self-pay | Admitting: *Deleted

## 2023-12-13 ENCOUNTER — Telehealth: Payer: Self-pay | Admitting: *Deleted

## 2023-12-13 DIAGNOSIS — Z860100 Personal history of colon polyps, unspecified: Secondary | ICD-10-CM

## 2023-12-13 NOTE — Telephone Encounter (Signed)
Colonoscopy schedule with Dr Servando Snare on 02/18/2024 in [place]

## 2023-12-13 NOTE — Telephone Encounter (Signed)
Patient called in to schedule colonoscopy.

## 2023-12-13 NOTE — Telephone Encounter (Signed)
Gastroenterology Pre-Procedure Review  Request Date: 02/18/2024 Requesting Physician: Dr. Servando Snare  PATIENT REVIEW QUESTIONS: The patient responded to the following health history questions as indicated:    1. Are you having any GI issues? no 2. Do you have a personal history of Polyps? yes (last colonoscopy with Dr Servando Snare on 11/21/2018) 3. Do you have a family history of Colon Cancer or Polyps? no 4. Diabetes Mellitus? no 5. Joint replacements in the past 12 months?no 6. Major health problems in the past 3 months?no 7. Any artificial heart valves, MVP, or defibrillator?no    MEDICATIONS & ALLERGIES:    Patient reports the following regarding taking any anticoagulation/antiplatelet therapy:   Plavix, Coumadin, Eliquis, Xarelto, Lovenox, Pradaxa, Brilinta, or Effient? no Aspirin? no  Patient confirms/reports the following medications:  Current Outpatient Medications  Medication Sig Dispense Refill   Cholecalciferol (VITAMIN D) 50 MCG (2000 UT) CAPS Take 1 capsule by mouth daily.     latanoprost (XALATAN) 0.005 % ophthalmic solution Place 1 drop into both eyes at bedtime.     metoprolol succinate (TOPROL-XL) 25 MG 24 hr tablet Take 25 mg by mouth daily. Patient stated she is taking 1/2 tablet     No current facility-administered medications for this visit.    Patient confirms/reports the following allergies:  Allergies  Allergen Reactions   Pravastatin     Other Reaction(s): Muscle Pain   Penicillins Rash    No orders of the defined types were placed in this encounter.   AUTHORIZATION INFORMATION Primary Insurance: 1D#: Group #:  Secondary Insurance: 1D#: Group #:  SCHEDULE INFORMATION: Date: 02/18/2024 Time: Location: [facility]

## 2023-12-30 ENCOUNTER — Other Ambulatory Visit: Payer: Self-pay | Admitting: Podiatry

## 2023-12-30 DIAGNOSIS — M2012 Hallux valgus (acquired), left foot: Secondary | ICD-10-CM | POA: Diagnosis not present

## 2023-12-30 DIAGNOSIS — I447 Left bundle-branch block, unspecified: Secondary | ICD-10-CM | POA: Diagnosis not present

## 2024-01-03 ENCOUNTER — Encounter: Payer: Self-pay | Admitting: Podiatry

## 2024-01-06 ENCOUNTER — Encounter: Payer: Self-pay | Admitting: Podiatry

## 2024-01-06 NOTE — Discharge Instructions (Signed)

## 2024-01-06 NOTE — Anesthesia Preprocedure Evaluation (Addendum)
 Anesthesia Evaluation  Patient identified by MRN, date of birth, ID band Patient awake    Reviewed: Allergy & Precautions, H&P , NPO status , Patient's Chart, lab work & pertinent test results  History of Anesthesia Complications (+) PONV and history of anesthetic complications  Airway Mallampati: II  TM Distance: >3 FB Neck ROM: Full    Dental no notable dental hx.    Pulmonary neg pulmonary ROS   Pulmonary exam normal breath sounds clear to auscultation       Cardiovascular + CAD  negative cardio ROS Normal cardiovascular exam+ dysrhythmias  Rhythm:Regular Rate:Normal  01-21-23 DOPPLER ECHO and OTHER SPECIAL PROCEDURES                 Aortic: No AR                      No AS                         149.4 cm/sec peak vel      8.9 mmHg peak grad                         4.4 mmHg mean grad         1.8 cm^2 by DOPPLER                 Mitral: MILD MR                    No MS                         MV Inflow E Vel = 92.7 cm/sec     MV Annulus E'Vel = 7.0 cm/sec                         E/E'Ratio = 13.2              Tricuspid: MILD TR                    No TS                         238.3 cm/sec peak TR vel   25.7 mmHg peak RV pressure              Pulmonary: MILD PR                    No PS                         86.2 cm/sec peak vel       3.0 mmHg peak grad  _________________________________________________________________________________________  INTERPRETATION  MILD LV SYSTOLIC DYSFUNCTION (See above)  NORMAL RIGHT VENTRICULAR SYSTOLIC FUNCTION  MILD VALVULAR REGURGITATION (See above)  NO VALVULAR STENOSIS  IRREGULAR HEART RHYTHM CAPTURED THROUGHOUT EXAM  ESTIMATED EF: 40-45%  UNABLE TO MEASURE WITH AUTOCALCULATIONS DUE TO ARRHYTHMIA  MILD - MODERATE MR  MILD TR, PR  MILD LAE     04-19-23 LBBB, office note:1. Left bundle branch block: Found by EKG during preop clearance. CT heart angiogram reassuring for mild  non-obstructive CAD -no change to current management  2. Coronary artery disease/hypercholesterolemia: Mild nonobstructive CAD by CT heart angiogram, CT calcium score of 81.5  as recently referred to our  clinic for evaluation after preoperative EKG had revealed new left bundle branch block. She underwent Myoview which had showed evidence of borderline apical defect with no clear evidence of redistribution as well as globally depressed LV function EF 35-40%. Subsequently underwent CT heart angiogram which had showed mild nonobstructive CAD with a calcium score of 81.5. Echocardiogram had showed mild LV systolic dysfunction EF 45% with septal and inferior hypokinesis, no significant valvular abnormalities. She underwent Holter monitor which had showed predominant sinus rhythm with LBBB at mean heart rate of 88 bpm with frequent PVCs up to 25% of the total. She was started on metoprolol  for suppression of PVCs and overall feels improved since starting metoprolol . Feels that she has more energy. Denies any problems with chest pain, shortness of breath, leg edema, weakness, syncope. [facility] doing Zumba, spin classes, Pilates. Has not noticed any significant functional decline.       Neuro/Psych  Neuromuscular disease negative neurological ROS  negative psych ROS   GI/Hepatic negative GI ROS, Neg liver ROS,,,  Endo/Other  negative endocrine ROS    Renal/GU negative Renal ROS  negative genitourinary   Musculoskeletal negative musculoskeletal ROS (+)    Abdominal   Peds negative pediatric ROS (+)  Hematology negative hematology ROS (+)   Anesthesia Other Findings Fever blister  PONV (postoperative nausea and vomiting) Glaucoma  LBBB (left bundle branch block) Palpitations  CAD (coronary artery disease) Statin myopathy  Aortic atherosclerosis (HCC) HLD (hyperlipidemia)     Reproductive/Obstetrics negative OB  ROS                              Anesthesia Physical Anesthesia Plan  ASA: 3  Anesthesia Plan:    Post-op Pain Management:    Induction:   PONV Risk Score and Plan:   Airway Management Planned:   Additional Equipment:   Intra-op Plan:   Post-operative Plan:   Informed Consent:   Plan Discussed with:   Anesthesia Plan Comments:          Anesthesia Quick Evaluation

## 2024-01-12 ENCOUNTER — Encounter: Payer: Self-pay | Admitting: Podiatry

## 2024-01-12 ENCOUNTER — Encounter
Admission: RE | Disposition: A | Discharge status: Home / Self Care | Payer: Self-pay | Source: Home / Self Care | Attending: Podiatry

## 2024-01-12 ENCOUNTER — Ambulatory Visit: Payer: Self-pay

## 2024-01-12 ENCOUNTER — Other Ambulatory Visit: Payer: Self-pay

## 2024-01-12 ENCOUNTER — Ambulatory Visit: Payer: Self-pay | Admitting: Anesthesiology

## 2024-01-12 ENCOUNTER — Ambulatory Visit
Admission: RE | Admit: 2024-01-12 | Discharge: 2024-01-12 | Disposition: A | Discharge status: Home / Self Care | Payer: Medicare HMO | Attending: Podiatry | Admitting: Podiatry

## 2024-01-12 ENCOUNTER — Ambulatory Visit: Payer: Medicare HMO | Admitting: Anesthesiology

## 2024-01-12 DIAGNOSIS — M2012 Hallux valgus (acquired), left foot: Secondary | ICD-10-CM | POA: Diagnosis not present

## 2024-01-12 DIAGNOSIS — I2510 Atherosclerotic heart disease of native coronary artery without angina pectoris: Secondary | ICD-10-CM | POA: Insufficient documentation

## 2024-01-12 DIAGNOSIS — I447 Left bundle-branch block, unspecified: Secondary | ICD-10-CM | POA: Diagnosis not present

## 2024-01-12 DIAGNOSIS — I700 Atherosclerosis of aorta: Secondary | ICD-10-CM | POA: Diagnosis not present

## 2024-01-12 HISTORY — DX: Chronic cough: R05.3

## 2024-01-12 HISTORY — DX: Nonrheumatic pulmonary valve insufficiency: I37.1

## 2024-01-12 HISTORY — PX: BUNIONECTOMY: SHX129

## 2024-01-12 HISTORY — DX: Rheumatic tricuspid insufficiency: I07.1

## 2024-01-12 SURGERY — BUNIONECTOMY
Anesthesia: Choice | Site: Toe | Laterality: Left

## 2024-01-12 MED ORDER — EPHEDRINE 5 MG/ML INJ
INTRAVENOUS | Status: AC
Start: 2024-01-12 — End: ?
  Filled 2024-01-12: qty 5

## 2024-01-12 MED ORDER — MIDAZOLAM HCL 2 MG/2ML IJ SOLN
INTRAMUSCULAR | Status: AC
  Filled 2024-01-12: qty 2

## 2024-01-12 MED ORDER — CEFAZOLIN SODIUM-DEXTROSE 2-3 GM-%(50ML) IV SOLR
INTRAVENOUS | Status: AC
  Filled 2024-01-12: qty 50

## 2024-01-12 MED ORDER — BUPIVACAINE HCL (PF) 0.25 % IJ SOLN
INTRAMUSCULAR | Status: DC | PRN
  Administered 2024-01-12: 10 mL

## 2024-01-12 MED ORDER — ACETAMINOPHEN 10 MG/ML IV SOLN
INTRAVENOUS | Status: DC | PRN
  Administered 2024-01-12: 1000 mg via INTRAVENOUS

## 2024-01-12 MED ORDER — LIDOCAINE HCL (PF) 2 % IJ SOLN
INTRAMUSCULAR | Status: AC
  Filled 2024-01-12: qty 5

## 2024-01-12 MED ORDER — LIDOCAINE HCL (CARDIAC) PF 100 MG/5ML IV SOSY
PREFILLED_SYRINGE | INTRAVENOUS | Status: DC | PRN
  Administered 2024-01-12: 70 mg via INTRATRACHEAL

## 2024-01-12 MED ORDER — DEXAMETHASONE SODIUM PHOSPHATE 4 MG/ML IJ SOLN
INTRAMUSCULAR | Status: DC | PRN
  Administered 2024-01-12: 8 mg via INTRAVENOUS

## 2024-01-12 MED ORDER — MIDAZOLAM HCL 5 MG/5ML IJ SOLN
INTRAMUSCULAR | Status: DC | PRN
  Administered 2024-01-12: 2 mg via INTRAVENOUS

## 2024-01-12 MED ORDER — FENTANYL CITRATE (PF) 100 MCG/2ML IJ SOLN
INTRAMUSCULAR | Status: AC
  Filled 2024-01-12: qty 2

## 2024-01-12 MED ORDER — ONDANSETRON HCL 4 MG/2ML IJ SOLN
INTRAMUSCULAR | Status: DC | PRN
  Administered 2024-01-12: 4 mg via INTRAVENOUS

## 2024-01-12 MED ORDER — ONDANSETRON HCL 4 MG PO TABS: 4.0000 mg | ORAL_TABLET | Freq: Four times a day (QID) | ORAL | Status: DC | PRN

## 2024-01-12 MED ORDER — CEFAZOLIN SODIUM-DEXTROSE 2-4 GM/100ML-% IV SOLN
2.0000 g | INTRAVENOUS | Status: AC
  Administered 2024-01-12: 2 g via INTRAVENOUS

## 2024-01-12 MED ORDER — BUPIVACAINE LIPOSOME 1.3 % IJ SUSP
INTRAMUSCULAR | Status: AC
  Filled 2024-01-12: qty 10

## 2024-01-12 MED ORDER — LACTATED RINGERS IV SOLN: INTRAVENOUS | Status: DC

## 2024-01-12 MED ORDER — DEXAMETHASONE SODIUM PHOSPHATE 4 MG/ML IJ SOLN
INTRAMUSCULAR | Status: AC
  Filled 2024-01-12: qty 1

## 2024-01-12 MED ORDER — ONDANSETRON HCL 4 MG/2ML IJ SOLN: 4.0000 mg | Freq: Four times a day (QID) | INTRAMUSCULAR | Status: DC | PRN

## 2024-01-12 MED ORDER — METOCLOPRAMIDE HCL 5 MG PO TABS: 5.0000 mg | ORAL_TABLET | Freq: Three times a day (TID) | ORAL | Status: DC | PRN

## 2024-01-12 MED ORDER — ONDANSETRON HCL 4 MG/2ML IJ SOLN
INTRAMUSCULAR | Status: AC
  Filled 2024-01-12: qty 2

## 2024-01-12 MED ORDER — BUPIVACAINE LIPOSOME 1.3 % IJ SUSP
INTRAMUSCULAR | Status: DC | PRN
  Administered 2024-01-12: 10 mL

## 2024-01-12 MED ORDER — OXYCODONE HCL 5 MG PO TABS
10.0000 mg | ORAL_TABLET | ORAL | Status: DC | PRN
  Administered 2024-01-12: 10 mg via ORAL

## 2024-01-12 MED ORDER — METOCLOPRAMIDE HCL 5 MG/ML IJ SOLN
5.0000 mg | Freq: Three times a day (TID) | INTRAMUSCULAR | Status: DC | PRN
Start: 2024-01-12 — End: 2024-01-12

## 2024-01-12 MED ORDER — PROPOFOL 10 MG/ML IV BOLUS
INTRAVENOUS | Status: DC | PRN
  Administered 2024-01-12: 100 mg via INTRAVENOUS

## 2024-01-12 MED ORDER — OXYCODONE-ACETAMINOPHEN 5-325 MG PO TABS: 1.0000 | ORAL_TABLET | Freq: Four times a day (QID) | ORAL | 0 refills | Status: DC | PRN

## 2024-01-12 MED ORDER — OXYCODONE HCL 5 MG PO TABS
ORAL_TABLET | ORAL | Status: AC
  Filled 2024-01-12: qty 2

## 2024-01-12 MED ORDER — FENTANYL CITRATE (PF) 100 MCG/2ML IJ SOLN
INTRAMUSCULAR | Status: DC | PRN
  Administered 2024-01-12: 50 ug via INTRAVENOUS
  Administered 2024-01-12 (×2): 25 ug via INTRAVENOUS

## 2024-01-12 SURGICAL SUPPLY — 38 items
BENZOIN TINCTURE PRP APPL 2/3 (GAUZE/BANDAGES/DRESSINGS) ×1 IMPLANT
BLADE SAW LAPIPLASTY 40X11 (BLADE) IMPLANT
BLADE SURG 15 STRL LF DISP TIS (BLADE) IMPLANT
BNDG COHESIVE 4X5 TAN STRL LF (GAUZE/BANDAGES/DRESSINGS) ×1 IMPLANT
BNDG ELASTIC 4X5.8 VLCR NS LF (GAUZE/BANDAGES/DRESSINGS) ×1 IMPLANT
BNDG ESMARCH 4X12 STRL LF (GAUZE/BANDAGES/DRESSINGS) ×1 IMPLANT
BNDG GAUZE DERMACEA FLUFF 4 (GAUZE/BANDAGES/DRESSINGS) ×1 IMPLANT
BNDG STRETCH 4X75 STRL LF (GAUZE/BANDAGES/DRESSINGS) ×1 IMPLANT
BOOT STEPPER DURA MED (SOFTGOODS) IMPLANT
CANISTER SUCT 1200ML W/VALVE (MISCELLANEOUS) ×1 IMPLANT
COVER LIGHT HANDLE UNIVERSAL (MISCELLANEOUS) ×2 IMPLANT
DRAPE FLUOR MINI C-ARM 54X84 (DRAPES) ×1 IMPLANT
DURAPREP 26ML APPLICATOR (WOUND CARE) ×1 IMPLANT
ELECT REM PT RETURN 9FT ADLT (ELECTROSURGICAL) ×1
ELECTRODE REM PT RTRN 9FT ADLT (ELECTROSURGICAL) ×1 IMPLANT
GAUZE SPONGE 4X4 12PLY STRL (GAUZE/BANDAGES/DRESSINGS) ×1 IMPLANT
GAUZE XEROFORM 1X8 LF (GAUZE/BANDAGES/DRESSINGS) ×1 IMPLANT
GLOVE BIOGEL PI IND STRL 8 (GLOVE) ×2 IMPLANT
GLOVE SURG SS PI 7.5 STRL IVOR (GLOVE) ×2 IMPLANT
GOWN SPEC L4 XLG W/TWL (GOWN DISPOSABLE) ×1 IMPLANT
GOWN STRL REUS W/ TWL LRG LVL3 (GOWN DISPOSABLE) ×1 IMPLANT
KIT TURNOVER KIT A (KITS) ×1 IMPLANT
LAPIPLASTY SYS 4A (Orthopedic Implant) ×1 IMPLANT
NS IRRIG 500ML POUR BTL (IV SOLUTION) ×1 IMPLANT
PACK EXTREMITY ARMC (MISCELLANEOUS) ×1 IMPLANT
RASP SM TEAR CROSS CUT (RASP) IMPLANT
SCREW 2.7 HIGH PITCH LOCKING (Screw) IMPLANT
SCREW HIGH PITCH LOCK 2.7 (Screw) IMPLANT
STOCKINETTE IMPERVIOUS LG (DRAPES) ×1 IMPLANT
STRIP CLOSURE SKIN 1/4X4 (GAUZE/BANDAGES/DRESSINGS) ×1 IMPLANT
SUT ETHILON 3-0 FS-10 30 BLK (SUTURE) ×1
SUT MNCRL 4-0 27 PS-2 XMFL (SUTURE) ×1
SUT MNCRL 4-0 27XMFL (SUTURE) ×1
SUT VIC AB 3-0 SH 27X BRD (SUTURE) IMPLANT
SUT VIC AB 4-0 SH 27XANBCTRL (SUTURE) IMPLANT
SUTURE EHLN 3-0 FS-10 30 BLK (SUTURE) IMPLANT
SUTURE MNCRL 4-0 27XMF (SUTURE) ×1 IMPLANT
SYSTEM LAPIPLASTY 4A (Orthopedic Implant) IMPLANT

## 2024-01-12 NOTE — Transfer of Care (Signed)
 Immediate Anesthesia Transfer of Care Note  Patient: Ashlee Mccarthy  Procedure(s) Performed: [name] TYPE (Left: Toe)  Patient Location: PACU  Anesthesia Type: No value filed.  Level of Consciousness: awake, alert  and patient cooperative  Airway and Oxygen Therapy: Patient Spontanous Breathing and Patient connected to supplemental oxygen  Post-op Assessment: Post-op Vital signs reviewed, Patient's Cardiovascular Status Stable, Respiratory Function Stable, Patent Airway and No signs of Nausea or vomiting  Post-op Vital Signs: Reviewed and stable  Complications: No notable events documented.

## 2024-01-12 NOTE — Op Note (Signed)
 Operative note   Surgeon:Zinia Innocent Armed forces logistics/support/administrative officer: None    Preop diagnosis: 1)Hallux valgus deformity left foot.    Postop diagnosis: Same    Procedure: 1)Lapidus hallux valgus correction left foot.  2) intraoperative fluoroscopy without assistance of radiologist    EBL: Minimal    Anesthesia:local and general.  Local consisted of one-to-one mixture of 0.25% plain bupivacaine  and Exparel  long-acting anesthetic.  A total of 20 cc was used    Hemostasis: [place]f tourniquet inflated to 200 mmHg for 59 minutes    Specimen: None    Complications: None    Operative indications:Ashlee Mccarthy is an 73 y.o. that presents today for surgical intervention.  The risks/benefits/alternatives/complications have been discussed and consent has been given.    Procedure:  Patient was brought into the OR and placed on the operating table in thesupine position. After anesthesia was obtained theleft lower extremity was prepped and draped in usual sterile fashion.  Attention was then directed to the dorsomedial first MTPJ where an incision was performed.  Sharp and blunt dissection was carried down to the capsule.  The intermetatarsal space was then entered.  The conjoined tendon of the abductor was then freed from the base of the proximal phalanx.  Attention was directed to the dorsal aspect of the foot where a dorsal incision was made at the first met cuneiforms joint.  Sharp and blunt dissection was carried down to the periosteum.  Subperiosteal dissection was then undertaken.  This exposed the first met cuneiform joint.  This was then freed and loosened.  A small fulcrum was placed between the base of the first metatarsal and second metatarsal.  Next the joint positioner was placed on the medial aspect of the metatarsal.  A small stab incision was made at the second metatarsal.  Compression was placed for the positioner.  Good realignment of the first intermetatarsal angle was noted at this  time.  At this time the osteotomy cut guide was placed into the joint region.  2 vertical cuts were then made.  The cartilage material was removed from the first met cuneiform joint and the joint was then prepped with a 2.0 mm drill bit.  The joint compressor was then placed.  Good compression and realignment was noted.  Next the medial and dorsal locking plates were then placed from the lapiplasty set.  Realignment and stability was noted in all planes.  Attention was redirected to the dorsomedial first MTPJ.  A small T capsulotomy was performed.  The dorsomedial eminence was noted and transected and smoothed with a power rasp.  A small capsulorrhaphy was performed.  Closure was then performed after all areas were irrigated.  3-0 Vicryl for the capsular tissue.  4-0 Vicryl in subcutaneous tissue and a 4-0 Monocryl for the skin.   Further local anesthesia was performed at the end of the case.    Patient tolerated the procedure and anesthesia well.  Was transported from the OR to the PACU with all vital signs stable and vascular status intact. To be discharged per routine protocol.  Will follow up in approximately 1 week in the outpatient clinic.

## 2024-01-12 NOTE — H&P (Signed)
 HISTORY AND PHYSICAL INTERVAL NOTE:  01/12/2024  11:54 AM  Ashlee Mccarthy  has presented today for surgery, with the diagnosis of M20.12 - Hallux valgus of left foot.  The various methods of treatment have been discussed with the patient.  No guarantees were given.  After consideration of risks, benefits and other options for treatment, the patient has consented to surgery.  I have reviewed the patients' chart and labs.     Mccarthy history and physical examination was performed in my office.  The patient was reexamined.  There have been no changes to this history and physical examination.  Ashlee Mccarthy

## 2024-01-13 ENCOUNTER — Encounter: Payer: Self-pay | Admitting: Podiatry

## 2024-01-17 ENCOUNTER — Telehealth: Payer: Self-pay | Admitting: *Deleted

## 2024-01-17 NOTE — Telephone Encounter (Signed)
Spoken to patient to reschedule her colonoscopy because Dr Servando Snare will not be at [facility].  Requesting to reschedule to 02/24/2024 at [facility] with Dr Servando Snare.

## 2024-01-18 ENCOUNTER — Telehealth: Payer: Self-pay | Admitting: *Deleted

## 2024-01-18 DIAGNOSIS — M2012 Hallux valgus (acquired), left foot: Secondary | ICD-10-CM | POA: Diagnosis not present

## 2024-01-18 NOTE — Telephone Encounter (Signed)
Received  fax from [facility]  Patient is cleared to have procedure.   Patient was notified yesterday regarding rescheduling.

## 2024-01-25 NOTE — Anesthesia Postprocedure Evaluation (Signed)
Anesthesia Post Note  Patient: Ashlee Mccarthy  Procedure(s) Performed: Dorena Dew TYPE (Left: Toe)  Patient location during evaluation: PACU Anesthesia Type: General Level of consciousness: awake and alert Pain management: pain level controlled Vital Signs Assessment: post-procedure vital signs reviewed and stable Respiratory status: spontaneous breathing, nonlabored ventilation, respiratory function stable and patient connected to nasal cannula oxygen Cardiovascular status: blood pressure returned to baseline and stable Postop Assessment: no apparent nausea or vomiting Anesthetic complications: no   No notable events documented.   Last Vitals:  Vitals:   01/12/24 1341 01/12/24 1403  BP: (!) 100/46 129/64  Pulse: 74   Resp: 19   Temp: 36.6 C 36.6 C  SpO2: 99%     Last Pain:  Vitals:   01/13/24 0930  TempSrc:   PainSc: 0-No pain                 Aleaya Latona C Caya Soberanis

## 2024-02-04 ENCOUNTER — Telehealth: Payer: Self-pay

## 2024-02-04 NOTE — Telephone Encounter (Signed)
 Pt stated that due to her recent surgery, she would just like to cancel procedure at this time... Not to r/s... Pt was advised regarding possible changes in the future, not how long Dr Jinny will be doing outpatient as he is transitioning to hospital only  I called Endo and spoke to Trish to cancel procedure

## 2024-02-16 DIAGNOSIS — D0439 Carcinoma in situ of skin of other parts of face: Secondary | ICD-10-CM | POA: Diagnosis not present

## 2024-02-22 DIAGNOSIS — M2012 Hallux valgus (acquired), left foot: Secondary | ICD-10-CM | POA: Diagnosis not present

## 2024-02-24 ENCOUNTER — Ambulatory Visit: Admit: 2024-02-24 | Payer: Medicare HMO | Admitting: Gastroenterology

## 2024-02-24 SURGERY — COLONOSCOPY WITH PROPOFOL
Anesthesia: General

## 2024-03-16 ENCOUNTER — Ambulatory Visit: Payer: Medicare HMO

## 2024-03-16 DIAGNOSIS — Z1211 Encounter for screening for malignant neoplasm of colon: Secondary | ICD-10-CM

## 2024-03-16 DIAGNOSIS — Z0000 Encounter for general adult medical examination without abnormal findings: Secondary | ICD-10-CM

## 2024-03-16 NOTE — Patient Instructions (Addendum)
 Ashlee Mccarthy , Thank you for taking time to come for your Medicare Wellness Visit. I appreciate your ongoing commitment to your health goals. Please review the following plan we discussed and let me know if I can assist you in the future.   Referrals/Orders/Follow-Ups/Clinician Recommendations: REFERRAL FOR COLONOSCOPY YOUR BONE DENSITY SCAN IS ORDERED   This is a list of the screening recommended for you and due dates:  Health Maintenance  Topic Date Due   Zoster (Shingles) Vaccine (1 of 2) 09/01/1972   COVID-19 Vaccine (3 - Pfizer risk series) 04/01/2020   DEXA scan (bone density measurement)  10/17/2023   Colon Cancer Screening  11/22/2023   Flu Shot  03/27/2024*   Mammogram  10/27/2024   Medicare Annual Wellness Visit  03/16/2025   DTaP/Tdap/Td vaccine (2 - Td or Tdap) 09/16/2026   Pneumonia Vaccine  Completed   Hepatitis C Screening  Completed   HPV Vaccine  Aged Out  *Topic was postponed. The date shown is not the original due date.    Advanced directives: (ACP Link)Information on Advanced Care Planning can be found at [facility] [place] Advance Health Care Directives Advance Health Care Directives. [email or web address]   Next Medicare Annual Wellness Visit scheduled for next year: Yes   03/22/25 @ 8:50 AM BY PHONE

## 2024-03-16 NOTE — Progress Notes (Signed)
 Subjective:   Ashlee Mccarthy is a 73 y.o. who presents for a Medicare Wellness preventive visit.  Visit Complete: Virtual I connected with  Abran Duke on 03/16/24 by a audio enabled telemedicine application and verified that I am speaking with the correct person using two identifiers.  Patient Location: Home  Provider Location: Office/Clinic  I discussed the limitations of evaluation and management by telemedicine. The patient expressed understanding and agreed to proceed.  Vital Signs: Because this visit was a virtual/telehealth visit, some criteria may be missing or patient reported. Any vitals not documented were not able to be obtained and vitals that have been documented are patient reported.  VideoDeclined- This patient declined Librarian, academic. Therefore the visit was completed with audio only.  Persons Participating in Visit: Patient.  AWV Questionnaire: No: Patient Medicare AWV questionnaire was not completed prior to this visit.  Cardiac Risk Factors include: advanced age (>13men, >75 women);dyslipidemia;microalbuminuria     Objective:    There were no vitals filed for this visit. There is no height or weight on file to calculate BMI.     03/16/2024    8:52 AM 01/12/2024   10:50 AM 03/11/2023    8:48 AM 01/05/2023    3:00 PM 09/16/2022   11:14 AM 09/02/2021   11:52 AM 04/30/2020   11:32 AM  Advanced Directives  Does Patient Have a Medical Advance Directive? No No No No No No No  Would patient like information on creating a medical advance directive? No - Patient declined Yes (MAU/Ambulatory/Procedural Areas - Information given)  No - Patient declined Yes (MAU/Ambulatory/Procedural Areas - Information given) No - Patient declined Yes (MAU/Ambulatory/Procedural Areas - Information given)    Current Medications (verified) Outpatient Encounter Medications as of 03/16/2024  Medication Sig   Cholecalciferol (VITAMIN D) 50 MCG (2000 UT)  CAPS Take 1 capsule by mouth daily.   latanoprost (XALATAN) 0.005 % ophthalmic solution Place 1 drop into both eyes at bedtime.   metoprolol succinate (TOPROL-XL) 25 MG 24 hr tablet Take 25 mg by mouth daily. Patient stated she is taking 1/2 tablet   [DISCONTINUED] oxyCODONE-acetaminophen (PERCOCET) 5-325 MG tablet Take 1-2 tablets by mouth every 6 (six) hours as needed for severe pain (pain score 7-10). Max 6 tabs per day   No facility-administered encounter medications on file as of 03/16/2024.    Allergies (verified) Pravastatin and Penicillins   History: Past Medical History:  Diagnosis Date   Aortic atherosclerosis (HCC)    CAD (coronary artery disease)    Fever blister    Glaucoma    HLD (hyperlipidemia)    LBBB (left bundle branch block) 01/05/2023   Mild pulmonary valve regurgitation    Mild tricuspid regurgitation by prior echocardiogram    Palpitations    Persistent cough    PONV (postoperative nausea and vomiting)    Statin myopathy    Past Surgical History:  Procedure Laterality Date   ABDOMINAL HYSTERECTOMY     bladder tack     BUNIONECTOMY Right 09/16/2022   Procedure: LAPIDUS TYPE;  Surgeon: Gwyneth Revels, DPM;  Location: [facility];  Service: Podiatry;  Laterality: Right;   BUNIONECTOMY Left 01/12/2024   Procedure: BUNIONECTOMY LAPIDUS TYPE;  Surgeon: Gwyneth Revels, DPM;  Location: [facility];  Service: Orthopedics/Podiatry;  Laterality: Left;   CATARACT EXTRACTION W/ INTRAOCULAR LENS  IMPLANT, BILATERAL     CATARACT EXTRACTION, BILATERAL     COLONOSCOPY WITH PROPOFOL N/A 11/21/2018   Procedure: COLONOSCOPY WITH  BIOPSIES;  Surgeon: Midge Minium, MD;  Location: [facility];  Service: Endoscopy;  Laterality: N/A;   EXCISION PARTIAL PHALANX Right 09/16/2022   Procedure: EXCISION PARTIAL PHALANX - SECOND;  Surgeon: Gwyneth Revels, DPM;  Location: [facility];  Service: Podiatry;  Laterality: Right;   EYE SURGERY     Torn retina    FRACTURE SURGERY Bilateral    plate left wrist   OOPHORECTOMY     POLYPECTOMY N/A 11/21/2018   Procedure: POLYPECTOMY;  Surgeon: Midge Minium, MD;  Location: [facility];  Service: Endoscopy;  Laterality: N/A;   TUBAL LIGATION     Family History  Problem Relation Age of Onset   Cirrhosis Mother    Lung cancer Father    Cancer Father    Cancer Sister        ovarian   Ovarian cancer Sister    Kidney disease Sister    Cirrhosis Sister    COPD Sister    Peripheral Artery Disease Sister    Cancer Sister 60       pancreatic cancer   Cancer Brother        throat and lung   Colon cancer Maternal Aunt    Breast cancer Maternal Aunt        3 mat aunts   Cancer Maternal Aunt    Breast cancer Maternal Aunt    Cancer Maternal Aunt    Colon cancer Maternal Uncle    Breast cancer Other    Social History   Socioeconomic History   Marital status: Widowed    Spouse name: Not on file   Number of children: 2   Years of education: Not on file   Highest education level: Associate degree: academic program  Occupational History   Occupation: Sports administrator   Tobacco Use   Smoking status: Never   Smokeless tobacco: Never  Vaping Use   Vaping status: Never Used  Substance and Sexual Activity   Alcohol use: No   Drug use: No   Sexual activity: Not Currently  Other Topics Concern   Not on file  Social History Narrative   She lost her second husband March 2017 from complications of PVD ( they were married for 16 years )   Two children from the first marriage   Lives alone   Social Drivers of Corporate investment banker Strain: Low Risk  (03/16/2024)   Overall Financial Resource Strain (CARDIA)    Difficulty of Paying Living Expenses: Not hard at all  Food Insecurity: No Food Insecurity (03/16/2024)   Hunger Vital Sign    Worried About Running Out of Food in the Last Year: Never true    Ran Out of Food in the Last Year: Never true  Transportation Needs: No  Transportation Needs (03/16/2024)   PRAPARE - Administrator, Civil Service (Medical): No    Lack of Transportation (Non-Medical): No  Physical Activity: Sufficiently Active (03/16/2024)   Exercise Vital Sign    Days of Exercise per Week: 6 days    Minutes of Exercise per Session: 50 min  Stress: No Stress Concern Present (03/16/2024)   [employer] of Occupational Health - Occupational Stress Questionnaire    Feeling of Stress : Not at all  Social Connections: Moderately Integrated (03/16/2024)   Social Connection and Isolation Panel [NHANES]    Frequency of Communication with Friends and Family: More than three times a week    Frequency of Social Gatherings with Friends and Family:  Three times a week    Attends Religious Services: More than 4 times per year    Active Member of Clubs or Organizations: Yes    Attends Banker Meetings: More than 4 times per year    Marital Status: Widowed    Tobacco Counseling Counseling given: Not Answered    Clinical Intake:  Pre-visit preparation completed: Yes  Pain : No/denies pain     Nutritional Status: BMI of 19-24  Normal Nutritional Risks: None Diabetes: No  Lab Results  Component Value Date   HGBA1C 5.0 09/16/2016     How often do you need to have someone help you when you read instructions, pamphlets, or other written materials from your doctor or pharmacy?: 1 - Never  Interpreter Needed?: No  Information entered by :: Kennedy Bucker, LPN   Activities of Daily Living     03/16/2024    8:53 AM 03/13/2024   10:43 AM  In your present state of health, do you have any difficulty performing the following activities:  Hearing? 0 0  Vision? 0 0  Difficulty concentrating or making decisions? 0 0  Walking or climbing stairs? 0 0  Dressing or bathing? 0 0  Doing errands, shopping? 0 0  Preparing Food and eating ? N N  Using the Toilet? N N  In the past six months, have you accidently leaked urine?  N N  Do you have problems with loss of bowel control? N N  Managing your Medications? N N  Managing your Finances? N N  Housekeeping or managing your Housekeeping? N N    Patient Care Team: Alba Cory, MD as PCP - General (Family Medicine) Irene Limbo., MD (Ophthalmology) Dermatology, Erskine Speed, MD as Consulting Physician (Gastroenterology)  Indicate any recent Medical Services you may have received from other than [facility] providers in the past year (date may be approximate).     Assessment:   This is a routine wellness examination for [place].  Hearing/Vision screen Hearing Screening - Comments:: NO AIDS Vision Screening - Comments:: NO GLASSES- DR.BELL   Goals Addressed             This Visit's Progress    DIET - EAT MORE FRUITS AND VEGETABLES         Depression Screen     03/16/2024    8:51 AM 12/06/2023   10:11 AM 11/23/2023   11:07 AM 10/11/2023    1:51 PM 09/24/2023    8:51 AM 03/11/2023    8:45 AM 12/03/2022    8:05 AM  PHQ 2/9 Scores  PHQ - 2 Score 0 0 0 0 0 0 0  PHQ- 9 Score 0 0  0   0    Fall Risk     03/16/2024    8:53 AM 03/13/2024   10:43 AM 12/06/2023   10:11 AM 11/23/2023   11:07 AM 10/11/2023    1:51 PM  Fall Risk   Falls in the past year? 0 0 0 0 0  Number falls in past yr: 0  0 0   Injury with Fall? 0  0 0   Risk for fall due to : No Fall Risks  No Fall Risks  No Fall Risks  Follow up Falls prevention discussed;Falls evaluation completed  Falls prevention discussed;Education provided;Falls evaluation completed  Falls prevention discussed    MEDICARE RISK AT HOME:  Medicare Risk at Home Any stairs in or around the home?: Yes If so, are there any without  handrails?: No Home free of loose throw rugs in walkways, pet beds, electrical cords, etc?: Yes Adequate lighting in your home to reduce risk of falls?: Yes Life alert?: No Use of a cane, walker or w/c?: No Grab bars in the bathroom?: No Shower chair or bench in  shower?: No Elevated toilet seat or a handicapped toilet?: No  TIMED UP AND GO:  Was the test performed?  No  Cognitive Function: 6CIT completed        03/16/2024    8:54 AM 03/11/2023    8:50 AM  6CIT Screen  What Year? 0 points 0 points  What month? 0 points 0 points  What time? 0 points 0 points  Count back from 20 0 points 0 points  Months in reverse 0 points 0 points  Repeat phrase 2 points 0 points  Total Score 2 points 0 points    Immunizations Immunization History  Administered Date(s) Administered   Influenza, High Dose Seasonal PF 11/11/2021   Influenza,inj,Quad PF,6+ Mos 09/16/2016, 10/06/2018   PFIZER(Purple Top)SARS-COV-2 Vaccination 02/12/2020, 03/04/2020   PNEUMOCOCCAL CONJUGATE-20 08/20/2021   Pneumococcal Conjugate-13 04/11/2020   Tdap 09/16/2016   Zoster, Live 10/13/2016    Screening Tests Health Maintenance  Topic Date Due   Zoster Vaccines- Shingrix (1 of 2) 09/01/1972   COVID-19 Vaccine (3 - Pfizer risk series) 04/01/2020   DEXA SCAN  10/17/2023   Colonoscopy  11/22/2023   INFLUENZA VACCINE  03/27/2024 (Originally 07/29/2023)   MAMMOGRAM  10/27/2024   Medicare Annual Wellness (AWV)  03/16/2025   DTaP/Tdap/Td (2 - Td or Tdap) 09/16/2026   Pneumonia Vaccine 67+ Years old  Completed   Hepatitis C Screening  Completed   HPV VACCINES  Aged Out    Health Maintenance  Health Maintenance Due  Topic Date Due   Zoster Vaccines- Shingrix (1 of 2) 09/01/1972   COVID-19 Vaccine (3 - Pfizer risk series) 04/01/2020   DEXA SCAN  10/17/2023   Colonoscopy  11/22/2023   Health Maintenance Items Addressed: Referral sent to GI for colonoscopy MAMMOGRAM UP TO DATE BONE DENSITY ORDERED IN DECEMBER  Additional Screening:  Vision Screening: Recommended annual ophthalmology exams for early detection of glaucoma and other disorders of the eye.  Dental Screening: Recommended annual dental exams for proper oral hygiene  Community Resource Referral / Chronic  Care Management: CRR required this visit?  No   CCM required this visit?  No     Plan:     I have personally reviewed and noted the following in the patient's chart:   Medical and social history Use of alcohol, tobacco or illicit drugs  Current medications and supplements including opioid prescriptions. Patient is not currently taking opioid prescriptions. Functional ability and status Nutritional status Physical activity Advanced directives List of other physicians Hospitalizations, surgeries, and ER visits in previous 12 months Vitals Screenings to include cognitive, depression, and falls Referrals and appointments  In addition, I have reviewed and discussed with patient certain preventive protocols, quality metrics, and best practice recommendations. A written personalized care plan for preventive services as well as general preventive health recommendations were provided to patient.     Hal Hope, LPN   1/61/0960   After Visit Summary: (MyChart) Due to this being a telephonic visit, the after visit summary with patients personalized plan was offered to patient via MyChart   Notes:  REFERRAL SENT FOR COLONOSCOPY

## 2024-03-22 DIAGNOSIS — H40153 Residual stage of open-angle glaucoma, bilateral: Secondary | ICD-10-CM | POA: Diagnosis not present

## 2024-04-04 DIAGNOSIS — M2012 Hallux valgus (acquired), left foot: Secondary | ICD-10-CM | POA: Diagnosis not present

## 2024-04-06 ENCOUNTER — Ambulatory Visit
Admission: RE | Admit: 2024-04-06 | Discharge: 2024-04-06 | Disposition: A | Discharge status: Home / Self Care | Source: Ambulatory Visit | Attending: Nurse Practitioner | Admitting: Nurse Practitioner

## 2024-04-06 DIAGNOSIS — Z780 Asymptomatic menopausal state: Secondary | ICD-10-CM | POA: Insufficient documentation

## 2024-04-17 ENCOUNTER — Encounter: Payer: Self-pay | Admitting: Family Medicine

## 2024-04-18 ENCOUNTER — Ambulatory Visit (INDEPENDENT_AMBULATORY_CARE_PROVIDER_SITE_OTHER): Admitting: Family Medicine

## 2024-04-18 ENCOUNTER — Encounter: Payer: Self-pay | Admitting: Family Medicine

## 2024-04-18 VITALS — BP 132/68 | HR 94 | Temp 97.5°F | Resp 16 | Ht 64.02 in | Wt 147.4 lb

## 2024-04-18 DIAGNOSIS — M810 Age-related osteoporosis without current pathological fracture: Secondary | ICD-10-CM | POA: Diagnosis not present

## 2024-04-18 DIAGNOSIS — M5412 Radiculopathy, cervical region: Secondary | ICD-10-CM | POA: Diagnosis not present

## 2024-04-18 DIAGNOSIS — E7800 Pure hypercholesterolemia, unspecified: Secondary | ICD-10-CM

## 2024-04-18 DIAGNOSIS — J069 Acute upper respiratory infection, unspecified: Secondary | ICD-10-CM | POA: Diagnosis not present

## 2024-04-18 DIAGNOSIS — I700 Atherosclerosis of aorta: Secondary | ICD-10-CM | POA: Diagnosis not present

## 2024-04-18 DIAGNOSIS — I2510 Atherosclerotic heart disease of native coronary artery without angina pectoris: Secondary | ICD-10-CM

## 2024-04-18 MED ORDER — ALENDRONATE SODIUM 70 MG PO TABS: 70.0000 mg | ORAL_TABLET | ORAL | 3 refills | Status: AC

## 2024-04-18 MED ORDER — CELECOXIB 100 MG PO CAPS: 100.0000 mg | ORAL_CAPSULE | Freq: Two times a day (BID) | ORAL | 0 refills | Status: DC

## 2024-04-18 NOTE — Progress Notes (Signed)
 Name: Ashlee Mccarthy   MRN: [id number]    DOB: 03/31/1951   Date:04/18/2024       Progress Note  Subjective  Chief Complaint  Chief Complaint  Patient presents with   Consult    Osteoporosis treatment   Cough   Nasal Congestion   Sore Throat   Fever    On/off sx since Thursday, tested Friday for COVID-Negative   Discussed the use of AI scribe software for clinical note transcription with the patient, who gave verbal consent to proceed.  History of Present Illness Ashlee Mccarthy is a 73 year old female with osteoporosis who presents for follow-up on bone density results and recent upper respiratory symptoms.  Her recent bone density test showed worsening results compared to previous tests. The left femoral neck had a T-score of -2.6, indicating osteoporosis, while the total hip was -2.4, and the right side was -2.2, indicating osteopenia. The wrist showed a T-score of -3.8, which is significantly worse. She has been physically active and taking vitamin D  supplements. She started weight training but experienced pain in her arm, initially thought to be a muscle strain.  She experienced upper respiratory symptoms starting last Thursday with a severe headache, sore throat, chills, and fever, which resolved by Friday. She felt better over the weekend but developed congestion and hoarseness by Monday. A home COVID test was negative. Currently, she has congestion and hoarseness, with a mild sore throat.  She describes pain in her arm that radiates from the neck down to the arm, sometimes causing numbness near the thumb. The pain is described as achy and sometimes wakes her up at night. She has neck pain on the same side as the arm pain.  Atherosclerosis of Aorta and CAD, not on statin therapy due to myopathy, discussed other options but prefers discussing it further with cardiologist     Patient Active Problem List   Diagnosis Date Noted   Statin myopathy 12/03/2022   Atherosclerosis of  aorta (HCC) 02/23/2022   Coronary artery disease involving native coronary artery of native heart without angina pectoris 02/23/2022   Microalbuminuria 02/23/2022   Pure hypercholesterolemia 02/23/2022   Hallux valgus, acquired, bilateral 04/30/2020   Benign neoplasm of ascending colon    Osteoporosis 11/25/2016   Fever blister 09/16/2016   History of hysterectomy for benign disease 09/16/2016   Menopause syndrome 09/16/2016    Past Surgical History:  Procedure Laterality Date   ABDOMINAL HYSTERECTOMY     bladder tack     BUNIONECTOMY Right 09/16/2022   Procedure: LAPIDUS TYPE;  Surgeon: Anell Baptist, DPM;  Location: [facility];  Service: Podiatry;  Laterality: Right;   BUNIONECTOMY Left 01/12/2024   Procedure: BUNIONECTOMY LAPIDUS TYPE;  Surgeon: Anell Baptist, DPM;  Location: [facility];  Service: Orthopedics/Podiatry;  Laterality: Left;   CATARACT EXTRACTION W/ INTRAOCULAR LENS  IMPLANT, BILATERAL     CATARACT EXTRACTION, BILATERAL     COLONOSCOPY WITH PROPOFOL  N/A 11/21/2018   Procedure: COLONOSCOPY WITH BIOPSIES;  Surgeon: Marnee Sink, MD;  Location: [facility];  Service: Endoscopy;  Laterality: N/A;   EXCISION PARTIAL PHALANX Right 09/16/2022   Procedure: EXCISION PARTIAL PHALANX - SECOND;  Surgeon: Anell Baptist, DPM;  Location: [facility];  Service: Podiatry;  Laterality: Right;   EYE SURGERY     Torn retina   FRACTURE SURGERY Bilateral    plate left wrist   OOPHORECTOMY     POLYPECTOMY N/A 11/21/2018   Procedure: POLYPECTOMY;  Surgeon: Marnee Sink, MD;  Location: [facility];  Service: Endoscopy;  Laterality: N/A;   TUBAL LIGATION      Family History  Problem Relation Age of Onset   Cirrhosis Mother    Lung cancer Father    Cancer Father    Cancer Sister        ovarian   Ovarian cancer Sister    Kidney disease Sister    Cirrhosis Sister    COPD Sister    Peripheral Artery Disease Sister    Cancer Sister 57        pancreatic cancer   Cancer Brother        throat and lung   Colon cancer Maternal Aunt    Breast cancer Maternal Aunt        3 mat aunts   Cancer Maternal Aunt    Breast cancer Maternal Aunt    Cancer Maternal Aunt    Colon cancer Maternal Uncle    Breast cancer Other     Social History   Tobacco Use   Smoking status: Never   Smokeless tobacco: Never  Substance Use Topics   Alcohol use: No     Current Outpatient Medications:    Cholecalciferol (VITAMIN D ) 50 MCG (2000 UT) CAPS, Take 1 capsule by mouth daily., Disp: , Rfl:    latanoprost (XALATAN) 0.005 % ophthalmic solution, Place 1 drop into both eyes at bedtime., Disp: , Rfl:    metoprolol  succinate (TOPROL -XL) 25 MG 24 hr tablet, Take 25 mg by mouth daily. Patient stated she is taking 1/2 tablet, Disp: , Rfl:   Allergies  Allergen Reactions   Pravastatin     Other Reaction(s): Muscle Pain   Penicillins Rash    I personally reviewed active problem list, medication list, allergies, family history with the patient/caregiver today.   ROS  Ten systems reviewed and is negative except as mentioned in HPI    Objective Physical Exam Constitutional: Patient appears well-developed and well-nourished.  No distress.  HEENT: head atraumatic, normocephalic, pupils equal and reactive to light, ears normal TM bilaterally , neck supple, throat within normal limits Cardiovascular: Normal rate, regular rhythm and normal heart sounds.  No murmur heard. No BLE edema. Pulmonary/Chest: Effort normal and breath sounds normal. No respiratory distress. Abdominal: Soft.  There is no tenderness. Neuro: normal grip, normal rom of neck Psychiatric: Patient has a normal mood and affect. behavior is normal. Judgment and thought content normal.    Vitals:   04/18/24 1120  BP: 132/68  Pulse: 94  Resp: 16  Temp: (!) 97.5 F (36.4 C)  TempSrc: Oral  SpO2: 96%  Weight: 147 lb 6.4 oz (66.9 kg)  Height: 5' 4.02" (1.626 m)    Body mass  index is 25.29 kg/m.    PHQ2/9:    03/16/2024    8:51 AM 12/06/2023   10:11 AM 11/23/2023   11:07 AM 10/11/2023    1:51 PM 09/24/2023    8:51 AM  Depression screen PHQ 2/9  Decreased Interest 0 0 0 0 0  Down, Depressed, Hopeless 0 0 0 0 0  PHQ - 2 Score 0 0 0 0 0  Altered sleeping 0 0  0   Tired, decreased energy 0 0  0   Change in appetite 0 0  0   Feeling bad or failure about yourself  0 0  0   Trouble concentrating 0 0  0   Moving slowly or fidgety/restless 0 0  0   Suicidal thoughts 0 0  0   PHQ-9 Score 0 0  0   Difficult doing work/chores Not difficult at all Not difficult at all       phq 9 is negative  Fall Risk:    03/16/2024    8:53 AM 03/13/2024   10:43 AM 12/06/2023   10:11 AM 11/23/2023   11:07 AM 10/11/2023    1:51 PM  Fall Risk   Falls in the past year? 0 0 0 0 0  Number falls in past yr: 0  0 0   Injury with Fall? 0  0 0   Risk for fall due to : No Fall Risks  No Fall Risks  No Fall Risks  Follow up Falls prevention discussed;Falls evaluation completed  Falls prevention discussed;Education provided;Falls evaluation completed  Falls prevention discussed     Assessment & Plan Upper respiratory infection Improving with residual hoarseness and congestion. Negative COVID test.  - Continue over-the-counter cold and cough medications as needed. - Gargle with warm salted water . - Use saline spray. - Rest vocal cords.  Cervical radiculitis Intermittent pain radiating from neck to arm, suggesting C5-C6 involvement. Conservative management with anti-inflammatory medication. Discussed potential use of Lyrica or gabapentin if symptoms worsen. - Prescribe Celebrex  for 5 days, twice daily, then as needed. - Consider x-ray if symptoms persist or worsen. - Consider Lyrica or gabapentin if symptoms worsen, especially at night.  Atherosclerosis of Aorta and CAD  Managed with metoprolol . LDL 114, HDL 61. Statins not tolerated. Discussed alternative non-statin  medications to reduce cardiovascular risk such as Nexlizet or even Zetia - Discuss alternative non-statin medications with cardiologist at upcoming appointment, since she does not want to start anything today.   Hyperlipidemia LDL 114. Statins not tolerated. Discussed alternative non-statin medications to manage cholesterol levels. - Discuss alternative non-statin medications with cardiologist at upcoming appointment.  Osteoporosis Worsening osteoporosis with significant decrease in bone density. Discussed treatment options including alendronate  and importance of calcium and vitamin D . Discussed risks of alendronate  and alternative treatments if not tolerated. - Start alendronate  (Fosamax ) once weekly with a full glass of water , remain upright for at least one hour after taking. - Ensure high calcium diet and continue vitamin D  2000 units daily. - Schedule bone density scan in one year to assess treatment efficacy.

## 2024-04-20 ENCOUNTER — Other Ambulatory Visit: Payer: Self-pay | Admitting: Family Medicine

## 2024-04-20 ENCOUNTER — Telehealth: Payer: Self-pay

## 2024-04-20 DIAGNOSIS — J40 Bronchitis, not specified as acute or chronic: Secondary | ICD-10-CM

## 2024-04-20 MED ORDER — AZITHROMYCIN 250 MG PO TABS: ORAL_TABLET | ORAL | 0 refills | Status: AC

## 2024-04-20 MED ORDER — PREDNISONE 10 MG PO TABS: 10.0000 mg | ORAL_TABLET | Freq: Two times a day (BID) | ORAL | 0 refills | Status: DC

## 2024-04-20 NOTE — Telephone Encounter (Signed)
 Congestion is worst now in chest, hoarse voice, no fever. Pt states she has tessalon  from previous sickness.

## 2024-04-20 NOTE — Telephone Encounter (Signed)
 Pt notified- verbalized understanding.

## 2024-04-20 NOTE — Telephone Encounter (Signed)
 Copied from CRM [id number]. Topic: Clinical - Medical Advice >> Apr 20, 2024  8:43 AM Lotus Round B wrote: Reason for CRM: pt called in stating that she seen the doctor on 4/22 and she was told to call back in if her cold didn't get any better . She said that she actually feels a lot worse . She was wondering if the doctor can send something in to the pharmacy . If any questions to give her a call .

## 2024-05-03 DIAGNOSIS — I447 Left bundle-branch block, unspecified: Secondary | ICD-10-CM | POA: Diagnosis not present

## 2024-05-03 DIAGNOSIS — I493 Ventricular premature depolarization: Secondary | ICD-10-CM | POA: Diagnosis not present

## 2024-05-03 DIAGNOSIS — I2510 Atherosclerotic heart disease of native coronary artery without angina pectoris: Secondary | ICD-10-CM | POA: Diagnosis not present

## 2024-05-03 DIAGNOSIS — E7800 Pure hypercholesterolemia, unspecified: Secondary | ICD-10-CM | POA: Diagnosis not present

## 2024-05-15 ENCOUNTER — Other Ambulatory Visit: Payer: Self-pay | Admitting: Family Medicine

## 2024-05-15 DIAGNOSIS — M5412 Radiculopathy, cervical region: Secondary | ICD-10-CM

## 2024-06-07 DIAGNOSIS — D225 Melanocytic nevi of trunk: Secondary | ICD-10-CM | POA: Diagnosis not present

## 2024-06-07 DIAGNOSIS — D485 Neoplasm of uncertain behavior of skin: Secondary | ICD-10-CM | POA: Diagnosis not present

## 2024-06-07 DIAGNOSIS — D2261 Melanocytic nevi of right upper limb, including shoulder: Secondary | ICD-10-CM | POA: Diagnosis not present

## 2024-06-07 DIAGNOSIS — Z85828 Personal history of other malignant neoplasm of skin: Secondary | ICD-10-CM | POA: Diagnosis not present

## 2024-06-07 DIAGNOSIS — C44519 Basal cell carcinoma of skin of other part of trunk: Secondary | ICD-10-CM | POA: Diagnosis not present

## 2024-06-07 DIAGNOSIS — L570 Actinic keratosis: Secondary | ICD-10-CM | POA: Diagnosis not present

## 2024-06-07 DIAGNOSIS — D2272 Melanocytic nevi of left lower limb, including hip: Secondary | ICD-10-CM | POA: Diagnosis not present

## 2024-06-07 DIAGNOSIS — D2262 Melanocytic nevi of left upper limb, including shoulder: Secondary | ICD-10-CM | POA: Diagnosis not present

## 2024-07-13 DIAGNOSIS — H524 Presbyopia: Secondary | ICD-10-CM | POA: Diagnosis not present

## 2024-07-13 DIAGNOSIS — H40153 Residual stage of open-angle glaucoma, bilateral: Secondary | ICD-10-CM | POA: Diagnosis not present

## 2024-07-19 DIAGNOSIS — R208 Other disturbances of skin sensation: Secondary | ICD-10-CM | POA: Diagnosis not present

## 2024-07-19 DIAGNOSIS — C44519 Basal cell carcinoma of skin of other part of trunk: Secondary | ICD-10-CM | POA: Diagnosis not present

## 2024-07-19 DIAGNOSIS — L820 Inflamed seborrheic keratosis: Secondary | ICD-10-CM | POA: Diagnosis not present

## 2024-07-19 DIAGNOSIS — L538 Other specified erythematous conditions: Secondary | ICD-10-CM | POA: Diagnosis not present

## 2024-09-12 DIAGNOSIS — I2510 Atherosclerotic heart disease of native coronary artery without angina pectoris: Secondary | ICD-10-CM | POA: Diagnosis not present

## 2024-09-18 ENCOUNTER — Ambulatory Visit
Admission: RE | Admit: 2024-09-18 | Discharge: 2024-09-18 | Disposition: A | Discharge status: Home / Self Care | Attending: Internal Medicine | Admitting: Internal Medicine

## 2024-09-18 ENCOUNTER — Encounter: Payer: Self-pay | Admitting: Internal Medicine

## 2024-09-18 ENCOUNTER — Other Ambulatory Visit: Payer: Self-pay

## 2024-09-18 ENCOUNTER — Encounter
Admission: RE | Disposition: A | Discharge status: Home / Self Care | Payer: Self-pay | Source: Home / Self Care | Attending: Internal Medicine

## 2024-09-18 DIAGNOSIS — I200 Unstable angina: Secondary | ICD-10-CM | POA: Diagnosis not present

## 2024-09-18 DIAGNOSIS — I493 Ventricular premature depolarization: Secondary | ICD-10-CM | POA: Diagnosis not present

## 2024-09-18 DIAGNOSIS — I447 Left bundle-branch block, unspecified: Secondary | ICD-10-CM | POA: Insufficient documentation

## 2024-09-18 DIAGNOSIS — E7800 Pure hypercholesterolemia, unspecified: Secondary | ICD-10-CM | POA: Diagnosis not present

## 2024-09-18 DIAGNOSIS — Z79899 Other long term (current) drug therapy: Secondary | ICD-10-CM | POA: Insufficient documentation

## 2024-09-18 DIAGNOSIS — I25110 Atherosclerotic heart disease of native coronary artery with unstable angina pectoris: Secondary | ICD-10-CM | POA: Insufficient documentation

## 2024-09-18 HISTORY — PX: LEFT HEART CATH AND CORONARY ANGIOGRAPHY: CATH118249

## 2024-09-18 SURGERY — LEFT HEART CATH AND CORONARY ANGIOGRAPHY
Anesthesia: Moderate Sedation | Laterality: Left

## 2024-09-18 MED ORDER — LIDOCAINE HCL (PF) 1 % IJ SOLN
INTRAMUSCULAR | Status: DC | PRN
  Administered 2024-09-18: 2 mL

## 2024-09-18 MED ORDER — FENTANYL CITRATE (PF) 100 MCG/2ML IJ SOLN
INTRAMUSCULAR | Status: DC | PRN
  Administered 2024-09-18: 25 ug via INTRAVENOUS

## 2024-09-18 MED ORDER — FENTANYL CITRATE (PF) 100 MCG/2ML IJ SOLN
INTRAMUSCULAR | Status: AC
  Filled 2024-09-18: qty 2

## 2024-09-18 MED ORDER — FREE WATER: 250.0000 mL | Freq: Once | Status: DC

## 2024-09-18 MED ORDER — ONDANSETRON HCL 4 MG/2ML IJ SOLN: 4.0000 mg | Freq: Four times a day (QID) | INTRAMUSCULAR | Status: DC | PRN

## 2024-09-18 MED ORDER — SODIUM CHLORIDE 0.9% FLUSH: 3.0000 mL | INTRAVENOUS | Status: DC | PRN

## 2024-09-18 MED ORDER — IOHEXOL 300 MG/ML  SOLN
INTRAMUSCULAR | Status: DC | PRN
  Administered 2024-09-18: 54 mL

## 2024-09-18 MED ORDER — MIDAZOLAM HCL 2 MG/2ML IJ SOLN
INTRAMUSCULAR | Status: DC | PRN
  Administered 2024-09-18: 1 mg via INTRAVENOUS

## 2024-09-18 MED ORDER — VERAPAMIL HCL 2.5 MG/ML IV SOLN
INTRAVENOUS | Status: DC | PRN
  Administered 2024-09-18: 2.5 mg via INTRA_ARTERIAL

## 2024-09-18 MED ORDER — SODIUM CHLORIDE 0.9 % IV SOLN: 250.0000 mL | INTRAVENOUS | Status: DC | PRN

## 2024-09-18 MED ORDER — SODIUM CHLORIDE 0.9 % IV SOLN
250.0000 mL | INTRAVENOUS | Status: DC | PRN
  Administered 2024-09-18: 250 mL via INTRAVENOUS

## 2024-09-18 MED ORDER — HYDRALAZINE HCL 20 MG/ML IJ SOLN: 10.0000 mg | INTRAMUSCULAR | Status: DC | PRN

## 2024-09-18 MED ORDER — SODIUM CHLORIDE 0.9% FLUSH: 3.0000 mL | Freq: Two times a day (BID) | INTRAVENOUS | Status: DC

## 2024-09-18 MED ORDER — FREE WATER: 500.0000 mL | Freq: Once | Status: DC

## 2024-09-18 MED ORDER — ACETAMINOPHEN 325 MG PO TABS: 650.0000 mg | ORAL_TABLET | ORAL | Status: DC | PRN

## 2024-09-18 MED ORDER — ASPIRIN 81 MG PO CHEW
81.0000 mg | CHEWABLE_TABLET | ORAL | Status: AC
  Administered 2024-09-18: 81 mg via ORAL

## 2024-09-18 MED ORDER — PANTOPRAZOLE SODIUM 20 MG PO TBEC
20.0000 mg | DELAYED_RELEASE_TABLET | Freq: Every day | ORAL | 0 refills | Status: DC
Start: 2024-09-18 — End: 2024-11-16

## 2024-09-18 MED ORDER — HEPARIN SODIUM (PORCINE) 1000 UNIT/ML IJ SOLN
INTRAMUSCULAR | Status: AC
  Filled 2024-09-18: qty 10

## 2024-09-18 MED ORDER — ASPIRIN 81 MG PO CHEW
CHEWABLE_TABLET | ORAL | Status: AC
  Filled 2024-09-18: qty 1

## 2024-09-18 MED ORDER — MIDAZOLAM HCL 2 MG/2ML IJ SOLN
INTRAMUSCULAR | Status: AC
  Filled 2024-09-18: qty 2

## 2024-09-18 MED ORDER — HEPARIN (PORCINE) IN NACL 1000-0.9 UT/500ML-% IV SOLN
INTRAVENOUS | Status: DC | PRN
  Administered 2024-09-18: 1000 mL

## 2024-09-18 MED ORDER — METOPROLOL SUCCINATE ER 25 MG PO TB24: 25.0000 mg | ORAL_TABLET | Freq: Every day | ORAL | 0 refills | Status: AC

## 2024-09-18 MED ORDER — HEPARIN SODIUM (PORCINE) 1000 UNIT/ML IJ SOLN
INTRAMUSCULAR | Status: DC | PRN
  Administered 2024-09-18: 3000 [IU] via INTRAVENOUS

## 2024-09-18 MED ORDER — VERAPAMIL HCL 2.5 MG/ML IV SOLN
INTRAVENOUS | Status: AC
  Filled 2024-09-18: qty 2

## 2024-09-18 MED ORDER — LIDOCAINE HCL 1 % IJ SOLN
INTRAMUSCULAR | Status: AC
  Filled 2024-09-18: qty 20

## 2024-09-18 MED ORDER — HEPARIN (PORCINE) IN NACL 1000-0.9 UT/500ML-% IV SOLN
INTRAVENOUS | Status: AC
  Filled 2024-09-18: qty 1000

## 2024-09-18 SURGICAL SUPPLY — 9 items
CATH INFINITI 5 FR JL3.5 (CATHETERS) IMPLANT
CATH INFINITI JR4 5F (CATHETERS) IMPLANT
DEVICE RAD TR BAND REGULAR (VASCULAR PRODUCTS) IMPLANT
DRAPE BRACHIAL (DRAPES) IMPLANT
GLIDESHEATH SLEND SS 6F .021 (SHEATH) IMPLANT
GUIDEWIRE INQWIRE 1.5J.035X260 (WIRE) IMPLANT
PACK CARDIAC CATH (CUSTOM PROCEDURE TRAY) ×1 IMPLANT
SET ATX-X65L (MISCELLANEOUS) IMPLANT
STATION PROTECTION PRESSURIZED (MISCELLANEOUS) IMPLANT

## 2024-09-20 ENCOUNTER — Encounter: Payer: Self-pay | Admitting: Internal Medicine

## 2024-09-27 ENCOUNTER — Other Ambulatory Visit: Payer: Self-pay | Admitting: Family Medicine

## 2024-09-27 DIAGNOSIS — Z1231 Encounter for screening mammogram for malignant neoplasm of breast: Secondary | ICD-10-CM

## 2024-10-11 DIAGNOSIS — I493 Ventricular premature depolarization: Secondary | ICD-10-CM | POA: Diagnosis not present

## 2024-10-11 DIAGNOSIS — I447 Left bundle-branch block, unspecified: Secondary | ICD-10-CM | POA: Diagnosis not present

## 2024-10-11 DIAGNOSIS — I2510 Atherosclerotic heart disease of native coronary artery without angina pectoris: Secondary | ICD-10-CM | POA: Diagnosis not present

## 2024-10-11 DIAGNOSIS — I700 Atherosclerosis of aorta: Secondary | ICD-10-CM | POA: Diagnosis not present

## 2024-10-11 DIAGNOSIS — E7800 Pure hypercholesterolemia, unspecified: Secondary | ICD-10-CM | POA: Diagnosis not present

## 2024-10-24 ENCOUNTER — Other Ambulatory Visit: Payer: Self-pay | Admitting: Medical Genetics

## 2024-10-25 ENCOUNTER — Other Ambulatory Visit
Admission: RE | Admit: 2024-10-25 | Discharge: 2024-10-25 | Disposition: A | Discharge status: Home / Self Care | Payer: Self-pay | Source: Ambulatory Visit | Attending: Medical Genetics | Admitting: Medical Genetics

## 2024-10-26 ENCOUNTER — Telehealth: Payer: Self-pay

## 2024-10-26 NOTE — Telephone Encounter (Signed)
 Procedure clearance faxed to [facility]

## 2024-10-30 ENCOUNTER — Ambulatory Visit
Admission: RE | Admit: 2024-10-30 | Discharge: 2024-10-30 | Disposition: A | Discharge status: Home / Self Care | Source: Ambulatory Visit | Attending: Family Medicine | Admitting: Family Medicine

## 2024-10-30 DIAGNOSIS — Z1231 Encounter for screening mammogram for malignant neoplasm of breast: Secondary | ICD-10-CM | POA: Diagnosis not present

## 2024-11-02 ENCOUNTER — Telehealth: Payer: Self-pay | Admitting: Family Medicine

## 2024-11-02 NOTE — Telephone Encounter (Signed)
 Clearance re-faxed

## 2024-11-02 NOTE — Telephone Encounter (Signed)
 Pt requesting call back to schedule call back

## 2024-11-03 ENCOUNTER — Other Ambulatory Visit: Payer: Self-pay

## 2024-11-03 ENCOUNTER — Telehealth: Payer: Self-pay

## 2024-11-03 DIAGNOSIS — Z860100 Personal history of colon polyps, unspecified: Secondary | ICD-10-CM

## 2024-11-03 MED ORDER — NA SULFATE-K SULFATE-MG SULF 17.5-3.13-1.6 GM/177ML PO SOLN: 1.0000 | Freq: Once | ORAL | 0 refills | Status: AC

## 2024-11-03 NOTE — Telephone Encounter (Signed)
 Gastroenterology Pre-Procedure Review  Request Date: 02/06/25 Requesting Physician: Dr. Melany  PATIENT REVIEW QUESTIONS: The patient responded to the following health history questions as indicated:    1. Are you having any GI issues? no 2. Do you have a personal history of Polyps? yes (last colonoscopy performed by Dr. Jinny 11/21/18 recommended repeat in 5 years) 3. Do you have a family history of Colon Cancer or Polyps? no 4. Diabetes Mellitus? no 5. Joint replacements in the past 12 months?no 6. Major health problems in the past 3 months?no 7. Any artificial heart valves, MVP, or defibrillator?no 8. Cardiac history? Clearance was faxed back to office on 11/02/24.  Pt stated Dr. Bernita office informed her by phone that she is cleared for procedure.    MEDICATIONS & ALLERGIES:    Patient reports the following regarding taking any anticoagulation/antiplatelet therapy:   Plavix, Coumadin, Eliquis, Xarelto, Lovenox, Pradaxa, Brilinta, or Effient? no Aspirin ? no  Patient confirms/reports the following medications:  Current Outpatient Medications  Medication Sig Dispense Refill   Na Sulfate-K Sulfate-Mg Sulfate concentrate (SUPREP) 17.5-3.13-1.6 GM/177ML SOLN Take 1 kit (354 mLs total) by mouth once for 1 dose. 354 mL 0   alendronate  (FOSAMAX ) 70 MG tablet Take 1 tablet (70 mg total) by mouth every 7 (seven) days. Take with a full glass of water  on an empty stomach. 12 tablet 3   aspirin  EC 81 MG tablet Take 81 mg by mouth daily. Swallow whole.     Cholecalciferol (VITAMIN D ) 50 MCG (2000 UT) CAPS Take 2,000 Units by mouth daily.     latanoprost (XALATAN) 0.005 % ophthalmic solution Place 1 drop into both eyes at bedtime.     metoprolol  succinate (TOPROL -XL) 25 MG 24 hr tablet Take 1 tablet (25 mg total) by mouth daily. 30 tablet 0   pantoprazole  (PROTONIX ) 20 MG tablet Take 1 tablet (20 mg total) by mouth daily. 30 tablet 0   No current facility-administered medications for this  visit.    Patient confirms/reports the following allergies:  Allergies  Allergen Reactions   Pravastatin     Other Reaction(s): Muscle Pain   Penicillins Rash    No orders of the defined types were placed in this encounter.   AUTHORIZATION INFORMATION Primary Insurance: 1D#: Group #:  Secondary Insurance: 1D#: Group #:  SCHEDULE INFORMATION: Date: 02/06/25 Time: Location: [facility]

## 2024-11-04 LAB — GENECONNECT MOLECULAR SCREEN: Genetic Analysis Overall Interpretation: NEGATIVE

## 2024-11-08 NOTE — Telephone Encounter (Signed)
 Clearance form received and pt is cleared... Form sent to be scanned into chart

## 2024-11-16 ENCOUNTER — Ambulatory Visit (INDEPENDENT_AMBULATORY_CARE_PROVIDER_SITE_OTHER): Admitting: Family Medicine

## 2024-11-16 ENCOUNTER — Encounter: Payer: Self-pay | Admitting: Family Medicine

## 2024-11-16 VITALS — BP 118/72 | HR 87 | Resp 16 | Ht 64.0 in | Wt 153.3 lb

## 2024-11-16 DIAGNOSIS — I5022 Chronic systolic (congestive) heart failure: Secondary | ICD-10-CM | POA: Diagnosis not present

## 2024-11-16 DIAGNOSIS — G720 Drug-induced myopathy: Secondary | ICD-10-CM | POA: Diagnosis not present

## 2024-11-16 DIAGNOSIS — T466X5D Adverse effect of antihyperlipidemic and antiarteriosclerotic drugs, subsequent encounter: Secondary | ICD-10-CM

## 2024-11-16 DIAGNOSIS — R809 Proteinuria, unspecified: Secondary | ICD-10-CM | POA: Diagnosis not present

## 2024-11-16 DIAGNOSIS — M810 Age-related osteoporosis without current pathological fracture: Secondary | ICD-10-CM | POA: Diagnosis not present

## 2024-11-16 DIAGNOSIS — M79644 Pain in right finger(s): Secondary | ICD-10-CM

## 2024-11-16 DIAGNOSIS — I700 Atherosclerosis of aorta: Secondary | ICD-10-CM

## 2024-11-16 DIAGNOSIS — H40153 Residual stage of open-angle glaucoma, bilateral: Secondary | ICD-10-CM | POA: Diagnosis not present

## 2024-11-16 DIAGNOSIS — I493 Ventricular premature depolarization: Secondary | ICD-10-CM | POA: Diagnosis not present

## 2024-11-16 DIAGNOSIS — L608 Other nail disorders: Secondary | ICD-10-CM

## 2024-11-16 DIAGNOSIS — E7800 Pure hypercholesterolemia, unspecified: Secondary | ICD-10-CM

## 2024-11-16 DIAGNOSIS — I2510 Atherosclerotic heart disease of native coronary artery without angina pectoris: Secondary | ICD-10-CM

## 2024-11-16 DIAGNOSIS — T466X5A Adverse effect of antihyperlipidemic and antiarteriosclerotic drugs, initial encounter: Secondary | ICD-10-CM

## 2024-11-16 MED ORDER — EZETIMIBE 10 MG PO TABS: 10.0000 mg | ORAL_TABLET | Freq: Every day | ORAL | 3 refills | Status: AC

## 2024-11-16 NOTE — Progress Notes (Signed)
 Name: Ashlee Mccarthy   MRN: [id number]    DOB: 01/17/1951   Date:11/16/2024       Progress Note  Subjective  Chief Complaint  Chief Complaint  Patient presents with   Nail Problem    Fungus under nail, R hand middle finger   Discussed the use of AI scribe software for clinical note transcription with the patient, who gave verbal consent to proceed.  History of Present Illness Ashlee Mccarthy is a 73 year old female with arthritis who presents with right middle finger pain and nail bed deformity.  She has been experiencing persistent right middle finger pain for approximately six to seven weeks. Initially, there was a lesion that fluctuated in severity, becoming swollen and then subsiding. She treated it with Neosporin and cleaning, which provided temporary relief. Recently, the condition worsened, with the nail breaking and the area becoming inflamed and painful. The pain affects the joints of the right middle finger, which typically do not cause her pain despite her arthritis. The pain has been intermittent, with some days being worse than others. The nail bed deformity became noticeable around Saturday, and she has been managing it with Neosporin and washing. She is concerned about the ongoing nature of the problem and the potential for losing the nail.  She has a history of coronary artery disease and underwent a cardiac catheterization a few weeks ago due to chest pain. She reports that her cardiologist told her there was a small blockage found during the procedure, but it was not enough to require a stent. She is currently on metoprolol  and reports feeling well since the procedure, with no chest pain. She experiences shortness of breath with certain activities, such as walking on a track or going uphill, but not during other exercises like cycling or aerobics. She reports that her cardiologist told her her heart's pumping function was not as strong as normal.  She is on alendronate  for  osteoporosis, taking 70 mg weekly, and her last bone density test in April showed osteoporosis in the right forearm and osteopenia elsewhere. She takes vitamin D  and calcium supplements. She previously had proteinuria, but this has not been present in the last two years. She is scheduled for a colonoscopy in February and has not had recent blood work, which she plans to complete today.    Patient Active Problem List   Diagnosis Date Noted   Statin myopathy 12/03/2022   Atherosclerosis of aorta 02/23/2022   Coronary artery disease involving native coronary artery of native heart without angina pectoris 02/23/2022   Microalbuminuria 02/23/2022   Pure hypercholesterolemia 02/23/2022   Hallux valgus, acquired, bilateral 04/30/2020   Benign neoplasm of ascending colon    Osteoporosis 11/25/2016   Fever blister 09/16/2016   History of hysterectomy for benign disease 09/16/2016   Menopause syndrome 09/16/2016    Past Surgical History:  Procedure Laterality Date   ABDOMINAL HYSTERECTOMY     bladder tack     BUNIONECTOMY Right 09/16/2022   Procedure: LAPIDUS TYPE;  Surgeon: Ashley Soulier, DPM;  Location: [facility];  Service: Podiatry;  Laterality: Right;   BUNIONECTOMY Left 01/12/2024   Procedure: BUNIONECTOMY LAPIDUS TYPE;  Surgeon: Ashley Soulier, DPM;  Location: [facility];  Service: Orthopedics/Podiatry;  Laterality: Left;   CATARACT EXTRACTION W/ INTRAOCULAR LENS  IMPLANT, BILATERAL     CATARACT EXTRACTION, BILATERAL     COLONOSCOPY WITH PROPOFOL  N/A 11/21/2018   Procedure: COLONOSCOPY WITH BIOPSIES;  Surgeon: Jinny Carmine, MD;  Location:  [facility];  Service: Endoscopy;  Laterality: N/A;   EXCISION PARTIAL PHALANX Right 09/16/2022   Procedure: EXCISION PARTIAL PHALANX - SECOND;  Surgeon: Ashley Soulier, DPM;  Location: [facility];  Service: Podiatry;  Laterality: Right;   EYE SURGERY     Torn retina   FRACTURE SURGERY Bilateral    plate left wrist    LEFT HEART CATH AND CORONARY ANGIOGRAPHY Left 09/18/2024   Procedure: LEFT HEART CATH AND CORONARY ANGIOGRAPHY;  Surgeon: Florencio Cara BIRCH, MD;  Location: [facility];  Service: Cardiovascular;  Laterality: Left;   OOPHORECTOMY     POLYPECTOMY N/A 11/21/2018   Procedure: POLYPECTOMY;  Surgeon: Jinny Carmine, MD;  Location: [facility];  Service: Endoscopy;  Laterality: N/A;   TUBAL LIGATION      Family History  Problem Relation Age of Onset   Cirrhosis Mother    Lung cancer Father    Cancer Father    Cancer Sister        ovarian   Ovarian cancer Sister    Kidney disease Sister    Cirrhosis Sister    COPD Sister    Peripheral Artery Disease Sister    Cancer Sister 51       pancreatic cancer   Cancer Brother        throat and lung   Colon cancer Maternal Aunt    Breast cancer Maternal Aunt        3 mat aunts   Cancer Maternal Aunt    Breast cancer Maternal Aunt    Cancer Maternal Aunt    Colon cancer Maternal Uncle    Breast cancer Other     Social History   Tobacco Use   Smoking status: Never   Smokeless tobacco: Never  Substance Use Topics   Alcohol use: No     Current Outpatient Medications:    alendronate  (FOSAMAX ) 70 MG tablet, Take 1 tablet (70 mg total) by mouth every 7 (seven) days. Take with a full glass of water  on an empty stomach., Disp: 12 tablet, Rfl: 3   Cholecalciferol (VITAMIN D ) 50 MCG (2000 UT) CAPS, Take 2,000 Units by mouth daily., Disp: , Rfl:    latanoprost (XALATAN) 0.005 % ophthalmic solution, Place 1 drop into both eyes at bedtime., Disp: , Rfl:    metoprolol  succinate (TOPROL -XL) 25 MG 24 hr tablet, Take 1 tablet (25 mg total) by mouth daily., Disp: 30 tablet, Rfl: 0   pantoprazole  (PROTONIX ) 20 MG tablet, Take 1 tablet (20 mg total) by mouth daily., Disp: 30 tablet, Rfl: 0   aspirin  EC 81 MG tablet, Take 81 mg by mouth daily. Swallow whole., Disp: , Rfl:   Allergies  Allergen Reactions   Pravastatin     Other  Reaction(s): Muscle Pain   Penicillins Rash    I personally reviewed active problem list, medication list, allergies with the patient/caregiver today.   ROS  Ten systems reviewed and is negative except as mentioned in HPI    Objective Physical Exam CONSTITUTIONAL: Patient appears well-developed and well-nourished. No distress. HEENT: Head atraumatic, normocephalic, neck supple. CARDIOVASCULAR: Normal rate, regular rhythm and normal heart sounds. No murmur heard. No BLE edema. PULMONARY: Effort normal and breath sounds normal. No respiratory distress. ABDOMINAL: There is no tenderness or distention. MUSCULOSKELETAL: Normal gait. Without gross motor or sensory deficit. Right middle finger nail bed deformity and inflammation. Also has enlarged DIP's and PIP's both hands PSYCHIATRIC: Patient has a normal mood and affect. Behavior is normal.  Judgment and thought content normal.     Vitals:   11/16/24 1005  BP: 118/72  Pulse: 87  Resp: 16  SpO2: 97%  Weight: 153 lb 4.8 oz (69.5 kg)  Height: 5' 4 (1.626 m)    Body mass index is 26.31 kg/m.  Recent Results (from the past 2160 hours)  [facility] Molecular Screen - Blood ([facility])     Status: None   Collection Time: 10/25/24 12:02 PM  Result Value Ref Range   Genetic Analysis Overall Interpretation Negative    Genetic Disease Assessed      This is a screening test and does not detect all pathogenic or likely pathogenic variant(s) in the tested genes; diagnostic testing is recommended for individuals with a personal or family history of heart disease or hereditary cancer. [facility] Tier One  Population Screen is a screening test that analyzes 11 genes related to hereditary breast and ovarian cancer (HBOC) syndrome, Lynch syndrome, and familial hypercholesterolemia. This test only reports clinically significant pathogenic and likely  pathogenic variants but does not report variants of uncertain significance (VUS). In  addition, analysis of the PMS2 gene excludes exons 11-15, which overlap with a known pseudogene (PMS2CL).    Genetic Analysis Report      No pathogenic or likely pathogenic variants were detected in the genes analyzed by this test.Genetic test results should be interpreted in the context of an individual's personal medical and family history. Alteration to medical management is NOT  recommended based solely on this result. Clinical correlation is advised.Additional Considerations- This is a screening test; individuals may still carry pathogenic or likely pathogenic variant(s) in the tested genes that are not detected by this test.-  For individuals at risk for these or other related conditions based on factors including personal or family history, diagnostic testing is recommended.- The absence of pathogenic or likely pathogenic variant(s) in the analyzed genes, while reassuring,  does not eliminate the possibility of a hereditary condition; there are other variants and genes associated with heart disease and hereditary cancer that are not included in this test.    Genes Tested See Notes     Comment: APOB, BRCA1, BRCA2, EPCAM, LDLR, LDLRAP1, PCSK9, PMS2, MLH1, MSH2, MSH6   Disclaimer See Notes     Comment: This test was developed and validated by [employer] This test has not been cleared or approved by the [place]  Food and Drug Administration (FDA). The [facility] laboratory is accredited by the College of American Pathologists (CAP) and certified under  the Clinical Laboratory Improvement Amendments (CLIA #: [id number]) to perform high-complexity clinical tests. This test is used for clinical purposes. It should not be regarded as investigational use only or for research use only.    Sequencing Location See Notes     Comment: Sequencing done at [facility] [address], [address], [place] [address] (CLIA# [id number])   Interpretation Methods and Limitations See Notes     Comment:  Extracted DNA is enriched for targeted regions and then sequenced using the [facility] Exome+ (R) assay on an Illumina DNA sequencing system. Data is then aligned to a modified version of GRCh38 and all genes are analyzed using the MANE transcript and MANE  Plus Clinical transcript, when available. Small variant calling is completed using a customized version of [employer]'s DNAseq software, augmented by a proprietary small variant caller for difficult variants. Copy number variants (CNVs) are then called  using a proprietary bioinformatics pipeline based on depth analysis  with a comparison to similarly sequenced samples. Analysis of the PMS2 gene is limited to exons 1-10. Both the MSH2 Boland inversion (exons 1-7) and the BRCA2 Alu insertion are detected  by identifying discordant read-pairs spanning the breakpoints. The interpretation and reporting of variants in APOB, PCSK9, and LDLR is specific to familial hypercholesterolemia; variants associated with hypobetalipoproteinemia are not i ncluded.  Interpretation is based upon guidelines published by the [employer] and [employer]), the Association for Molecular Pathology (AMP) or their modification by [employer] when available and/or review  of previous clinical assertions available in the [employer]. Interpretation is limited to the transcripts indicated on the report and +/- 10 bp into intronic regions, except as noted below. [facility] variant classifications include pathogenic, likely  pathogenic, variant of uncertain significance (VUS), likely benign, and benign. Only variants classified as pathogenic and likely pathogenic are included in the report. All reported variants are confirmed through secondary manual inspection of DNA  sequence data or orthogonal testing. Risk estimations and management guidelines included in this report are based on analysis of primary literature and recommendations of  applicable professional societies, and should be regarded  as approximations.Based  on validation studies, this assay delivers > 99% sensitivity and specificity for single nucleotide variants and insertions and deletions (indels) up to 20 bp. Larger indels and complex variants are also reported but sensitivity may be reduced. Based on  validation studies, this assay delivers > 99% sensitivity to multi-exon CNVs and > 90% sensitivity to single-exon CNVs. This test may not detect variants in challenging regions (such as short tandem repeats, homopolymer runs, and segment duplications),  sub-exonic CNVs, chromosomal aneuploidy, or variants in the presence of mosaicism. Phasing will be attempted and reported, when possible. Structural rearrangements such as inversions, translocations, complex rearrangements, and gene conversions are not  tested in this assay unless explicitly indicated. Additionally, deep intronic, promoter, and enhancer regions may not be covered. It is important to note that this is a screening test and cannot detect all di sease-causing variants. A negative result does  not guarantee the absence of a rare, undetectable variant in the genes analyzed; consider using a diagnostic test if there is significant personal and/or family history of one of the conditions analyzed by this test. Any potential incidental findings  outside of these genes and conditions will not be identified, nor reported. The results of a genetic test may be influenced by various factors, including bone marrow transplantation, blood transfusions, or in rare cases, hematolymphoid neoplasms.Gene  Specific Notes:APOB: analysis is limited to c.10580G>A and c.10579C>T; BRCA1: sequencing analysis extends to CDS +/-20 bp; BRCA2: analysis includes detection of [id number] and sequencing analysis extends to CDS +/-20 bp. EPCAM: analysis is limited  to CNV of exons 8-9; LDLR: analysis includes CNV of the promoter; MLH1: analysis  includes CNV of the promoter; MSH2: analysis includes detection of the Boland inversion (inversion of exons 1 -7) and detection of c.942+3A>T, PMS2: analysis is limited to  exons 1-10.Donnice JINNY Kemp, PhD, [email or web address]     Diabetic Foot Exam:     PHQ2/9:    11/16/2024   10:02 AM 03/16/2024    8:51 AM 12/06/2023   10:11 AM 11/23/2023   11:07 AM 10/11/2023    1:51 PM  Depression screen PHQ 2/9  Decreased Interest 0 0 0 0 0  Down, Depressed, Hopeless 0 0 0 0 0  PHQ - 2 Score 0 0 0 0 0  Altered sleeping  0 0  0  Tired, decreased energy  0 0  0  Change in appetite  0 0  0  Feeling bad or failure about yourself   0 0  0  Trouble concentrating  0 0  0  Moving slowly or fidgety/restless  0 0  0  Suicidal thoughts  0 0  0  PHQ-9 Score  0  0   0   Difficult doing work/chores  Not difficult at all Not difficult at all       Data saved with a previous flowsheet row definition    phq 9 is negative  Fall Risk:    11/16/2024   10:02 AM 03/16/2024    8:53 AM 03/13/2024   10:43 AM 12/06/2023   10:11 AM 11/23/2023   11:07 AM  Fall Risk   Falls in the past year? 0 0 0 0 0  Number falls in past yr: 0 0  0 0  Injury with Fall? 0 0  0 0  Risk for fall due to : No Fall Risks No Fall Risks  No Fall Risks   Follow up Falls evaluation completed Falls prevention discussed;Falls evaluation completed  Falls prevention discussed;Education provided;Falls evaluation completed       Assessment & Plan Pain and inflammation of right middle finger with nail bed deformity Chronic inflammation likely due to prolonged paronychia causing nail bed deformity and pain. Possible osteomyelitis considered. - Referred to hand specialist for evaluation and management. - Advised use of Band-Aid or splint to protect the nail bed.  Chronic systolic heart failure with atherosclerotic heart disease of native coronary artery and aortic atherosclerosis Chronic systolic heart failure with reduced  ejection fraction (45-50%). Recent catheterization showed 25% LAD lesion. Symptoms include exertional dyspnea. Low normal blood pressure limits medication options. - Continue metoprolol . - Encouraged physical activity and cholesterol management. - Consider SGLT2 agonist Pauletta) if symptoms worsen. - Monitor for increased shortness of breath or swelling.  Pure hypercholesterolemia LDL at 114 mg/dL. Statins not tolerated. Discussed ezetimibe (Zetia) as alternative to lower LDL and reduce cardiovascular risk. - Prescribed ezetimibe (Zetia) to lower LDL cholesterol.  Frequent SVT's Managed with beta-blocker therapy. No current symptoms of palpitations or tachycardia. - Continue beta-blocker therapy.  Age-related osteoporosis without current pathological fracture Osteoporosis with recent bone density showing osteoporotic changes in right forearm and osteopenia elsewhere. Fracture risk present. - Continue alendronate  70 mg weekly. - Ensure adequate vitamin D  and calcium intake.

## 2024-11-17 ENCOUNTER — Ambulatory Visit: Payer: Self-pay | Admitting: Family Medicine

## 2024-11-17 LAB — COMPREHENSIVE METABOLIC PANEL WITH GFR
AG Ratio: 2.4 (calc) (ref 1.0–2.5)
ALT: 14 U/L (ref 6–29)
AST: 26 U/L (ref 10–35)
Albumin: 4.5 g/dL (ref 3.6–5.1)
Alkaline phosphatase (APISO): 56 U/L (ref 37–153)
BUN: 15 mg/dL (ref 7–25)
CO2: 26 mmol/L (ref 20–32)
Calcium: 9.3 mg/dL (ref 8.6–10.4)
Chloride: 106 mmol/L (ref 98–110)
Creat: 0.77 mg/dL (ref 0.60–1.00)
Globulin: 1.9 g/dL (ref 1.9–3.7)
Glucose, Bld: 87 mg/dL (ref 65–99)
Potassium: 4.2 mmol/L (ref 3.5–5.3)
Sodium: 139 mmol/L (ref 135–146)
Total Bilirubin: 0.7 mg/dL (ref 0.2–1.2)
Total Protein: 6.4 g/dL (ref 6.1–8.1)
eGFR: 82 mL/min/1.73m2 (ref >=60)

## 2024-11-17 LAB — LIPID PANEL
Cholesterol: 170 mg/dL (ref <200)
HDL: 57 mg/dL (ref >=50)
LDL Cholesterol (Calc): 100 mg/dL — ABNORMAL HIGH
Non-HDL Cholesterol (Calc): 113 mg/dL (ref <130)
Total CHOL/HDL Ratio: 3 (calc) (ref <5.0)
Triglycerides: 52 mg/dL (ref <150)

## 2024-11-17 LAB — MICROALBUMIN / CREATININE URINE RATIO
Creatinine, Urine: 36 mg/dL (ref 20–275)
Microalb Creat Ratio: 8 mg/g{creat} (ref <30)
Microalb, Ur: 0.3 mg/dL

## 2024-12-06 NOTE — Patient Instructions (Signed)
 Preventive Care 83 Years and Older, Female Preventive care refers to lifestyle choices and visits with your health care provider that can promote health and wellness. Preventive care visits are also called wellness exams. What can I expect for my preventive care visit? Counseling Your health care provider may ask you questions about your: Medical history, including: Past medical problems. Family medical history. Pregnancy and menstrual history. History of falls. Current health, including: Memory and ability to understand (cognition). Emotional well-being. Home life and relationship well-being. Sexual activity and sexual health. Lifestyle, including: Alcohol, nicotine or tobacco, and drug use. Access to firearms. Diet, exercise, and sleep habits. Work and work Astronomer. Sunscreen use. Safety issues such as seatbelt and bike helmet use. Physical exam Your health care provider will check your: Height and weight. These may be used to calculate your BMI (body mass index). BMI is a measurement that tells if you are at a healthy weight. Waist circumference. This measures the distance around your waistline. This measurement also tells if you are at a healthy weight and may help predict your risk of certain diseases, such as type 2 diabetes and high blood pressure. Heart rate and blood pressure. Body temperature. Skin for abnormal spots. What immunizations do I need?  Vaccines are usually given at various ages, according to a schedule. Your health care provider will recommend vaccines for you based on your age, medical history, and lifestyle or other factors, such as travel or where you work. What tests do I need? Screening Your health care provider may recommend screening tests for certain conditions. This may include: Lipid and cholesterol levels. Hepatitis C test. Hepatitis B test. HIV (human immunodeficiency virus) test. STI (sexually transmitted infection) testing, if you are at  risk. Lung cancer screening. Colorectal cancer screening. Diabetes screening. This is done by checking your blood sugar (glucose) after you have not eaten for a while (fasting). Mammogram. Talk with your health care provider about how often you should have regular mammograms. BRCA-related cancer screening. This may be done if you have a family history of breast, ovarian, tubal, or peritoneal cancers. Bone density scan. This is done to screen for osteoporosis. Talk with your health care provider about your test results, treatment options, and if necessary, the need for more tests. Follow these instructions at home: Eating and drinking  Eat a diet that includes fresh fruits and vegetables, whole grains, lean protein, and low-fat dairy products. Limit your intake of foods with high amounts of sugar, saturated fats, and salt. Take vitamin and mineral supplements as recommended by your health care provider. Do not drink alcohol if your health care provider tells you not to drink. If you drink alcohol: Limit how much you have to 0-1 drink a day. Know how much alcohol is in your drink. In the [place] one drink equals one 12 oz bottle of beer (355 mL), one 5 oz glass of wine (148 mL), or one 1 oz glass of hard liquor (44 mL). Lifestyle Brush your teeth every morning and night with fluoride toothpaste. Floss one time each day. Exercise for at least 30 minutes 5 or more days each week. Do not use any products that contain nicotine or tobacco. These products include cigarettes, chewing tobacco, and vaping devices, such as e-cigarettes. If you need help quitting, ask your health care provider. Do not use drugs. If you are sexually active, practice safe sex. Use a condom or other form of protection in order to prevent STIs. Take aspirin only as told by  your health care provider. Make sure that you understand how much to take and what form to take. Work with your health care provider to find out whether it  is safe and beneficial for you to take aspirin daily. Ask your health care provider if you need to take a cholesterol-lowering medicine (statin). Find healthy ways to manage stress, such as: Meditation, yoga, or listening to music. Journaling. Talking to a trusted person. Spending time with friends and family. Minimize exposure to UV radiation to reduce your risk of skin cancer. Safety Always wear your seat belt while driving or riding in a vehicle. Do not drive: If you have been drinking alcohol. Do not ride with someone who has been drinking. When you are tired or distracted. While texting. If you have been using any mind-altering substances or drugs. Wear a helmet and other protective equipment during sports activities. If you have firearms in your house, make sure you follow all gun safety procedures. What's next? Visit your health care provider once a year for an annual wellness visit. Ask your health care provider how often you should have your eyes and teeth checked. Stay up to date on all vaccines. This information is not intended to replace advice given to you by your health care provider. Make sure you discuss any questions you have with your health care provider. Document Revised: 06/11/2021 Document Reviewed: 06/11/2021 Elsevier Patient Education  2024 [employer].

## 2024-12-07 ENCOUNTER — Ambulatory Visit: Payer: Self-pay | Admitting: Family Medicine

## 2024-12-07 ENCOUNTER — Encounter: Payer: Self-pay | Admitting: Family Medicine

## 2024-12-07 VITALS — BP 128/70 | HR 88 | Resp 16 | Ht 64.0 in | Wt 147.6 lb

## 2024-12-07 DIAGNOSIS — E7800 Pure hypercholesterolemia, unspecified: Secondary | ICD-10-CM

## 2024-12-07 DIAGNOSIS — I700 Atherosclerosis of aorta: Secondary | ICD-10-CM

## 2024-12-07 DIAGNOSIS — Z1231 Encounter for screening mammogram for malignant neoplasm of breast: Secondary | ICD-10-CM

## 2024-12-07 DIAGNOSIS — T466X5A Adverse effect of antihyperlipidemic and antiarteriosclerotic drugs, initial encounter: Secondary | ICD-10-CM

## 2024-12-07 DIAGNOSIS — Z0001 Encounter for general adult medical examination with abnormal findings: Secondary | ICD-10-CM | POA: Diagnosis not present

## 2024-12-07 DIAGNOSIS — M810 Age-related osteoporosis without current pathological fracture: Secondary | ICD-10-CM

## 2024-12-07 DIAGNOSIS — I5022 Chronic systolic (congestive) heart failure: Secondary | ICD-10-CM | POA: Insufficient documentation

## 2024-12-07 DIAGNOSIS — L820 Inflamed seborrheic keratosis: Secondary | ICD-10-CM | POA: Diagnosis not present

## 2024-12-07 DIAGNOSIS — G720 Drug-induced myopathy: Secondary | ICD-10-CM | POA: Diagnosis not present

## 2024-12-07 DIAGNOSIS — D2262 Melanocytic nevi of left upper limb, including shoulder: Secondary | ICD-10-CM | POA: Diagnosis not present

## 2024-12-07 DIAGNOSIS — D225 Melanocytic nevi of trunk: Secondary | ICD-10-CM | POA: Diagnosis not present

## 2024-12-07 DIAGNOSIS — Z85828 Personal history of other malignant neoplasm of skin: Secondary | ICD-10-CM | POA: Diagnosis not present

## 2024-12-07 DIAGNOSIS — Z0000 Encounter for general adult medical examination without abnormal findings: Secondary | ICD-10-CM

## 2024-12-07 DIAGNOSIS — D2272 Melanocytic nevi of left lower limb, including hip: Secondary | ICD-10-CM | POA: Diagnosis not present

## 2024-12-07 DIAGNOSIS — D485 Neoplasm of uncertain behavior of skin: Secondary | ICD-10-CM | POA: Diagnosis not present

## 2024-12-07 DIAGNOSIS — R208 Other disturbances of skin sensation: Secondary | ICD-10-CM | POA: Diagnosis not present

## 2024-12-07 DIAGNOSIS — D2261 Melanocytic nevi of right upper limb, including shoulder: Secondary | ICD-10-CM | POA: Diagnosis not present

## 2024-12-07 DIAGNOSIS — L570 Actinic keratosis: Secondary | ICD-10-CM | POA: Diagnosis not present

## 2024-12-07 NOTE — Progress Notes (Signed)
 Name: Ashlee Mccarthy   MRN: [id number]    DOB: [birth year]   Date:12/07/2024       Progress Note  Subjective  Chief Complaint  Chief Complaint  Patient presents with   Annual Exam    Refused breast exam since just had Mammogram    HPI  Patient presents for annual CPE.  Discussed the use of AI scribe software for clinical note transcription with the patient, who gave verbal consent to proceed.  History of Present Illness Ashlee Mccarthy is a 73 year old female with chronic congestive heart failure and coronary artery disease who presents for an annual physical exam.  Her previously inflamed finger has shown significant improvement with reduced inflammation and no pain. The nail is growing out, though it may eventually fall off.  She underwent a cardiac catheterization in September, revealing 25% stenosis in the LAD and mild left ventricular systolic dysfunction with an ejection fraction of 45-50%. Prior to the procedure, she experienced discomfort, but her symptoms have improved since her metoprolol  dosage was increased to 25 mg daily. She exercises almost daily and reports feeling well.  She is scheduled for a colonoscopy in February after a rescheduling from November. Recent blood work showed normal kidney and liver function, with cholesterol levels slightly above goal. She cannot take statins and is currently on Zetia  for cholesterol management.  She has a history of osteoporosis and has been on alendronate  since April.  She has a history of basal cell carcinoma on her face, which has been removed. She sees a dermatologist regularly and had a normal mammogram in November.  She has a family history of breast cancer, with two aunts, a niece, and two cousins affected. She underwent genetic testing, which returned normal results.  She maintains a healthy diet and has intentionally lost five pounds recently. She exercises regularly, participating in activities like Zumba, yoga,  Pilates, and spin classes at the [facility].  She has a history of proteinuria, which has normalized, and her vitamin D  levels are stable with supplementation. She had a hysterectomy in the past and does not require Pap smears. No bladder issues or symptoms of sexually transmitted diseases.      Diet: balanced diet  Exercise:  she goes to the [facility] almost every day  Last Eye Exam: completed Last Dental Exam: completed  Flowsheet Row Clinical Support from 03/16/2024 in [facility]  AUDIT-C Score 0   Depression: Phq 9 is  negative    12/07/2024    8:43 AM 11/16/2024   10:02 AM 03/16/2024    8:51 AM 12/06/2023   10:11 AM 11/23/2023   11:07 AM  Depression screen PHQ 2/9  Decreased Interest 0 0 0 0 0  Down, Depressed, Hopeless 0 0 0 0 0  PHQ - 2 Score 0 0 0 0 0  Altered sleeping   0 0   Tired, decreased energy   0 0   Change in appetite   0 0   Feeling bad or failure about yourself    0 0   Trouble concentrating   0 0   Moving slowly or fidgety/restless   0 0   Suicidal thoughts   0 0   PHQ-9 Score   0  0    Difficult doing work/chores   Not difficult at all Not difficult at all      Data saved with a previous flowsheet row definition   Hypertension: BP Readings from Last 3 Encounters:  12/07/24 128/70  11/16/24 118/72  09/18/24 135/66   Obesity: Wt Readings from Last 3 Encounters:  12/07/24 147 lb 9.6 oz (67 kg)  11/16/24 153 lb 4.8 oz (69.5 kg)  09/18/24 148 lb 11.2 oz (67.4 kg)   BMI Readings from Last 3 Encounters:  12/07/24 25.34 kg/m  11/16/24 26.31 kg/m  09/18/24 25.52 kg/m     Vaccines: reviewed with the patient.   Hep C Screening: completed STD testing and prevention (HIV/chl/gon/syphilis): N/A Intimate partner violence: negative screen  Sexual History : not sexually active  Menstrual History/LMP/Abnormal Bleeding: s/p hysterectomy  Discussed importance of follow up if any post-menopausal bleeding: not applicable  Incontinence  Symptoms: negative for symptoms   Breast cancer:  - Last Mammogram: up to date  - BRCA gene screening: she had genetic testing in the past   Osteoporosis Prevention : Discussed high calcium and vitamin D  supplementation, weight bearing exercises Bone density :yes   Cervical cancer screening: not applicable due to hysterectomy  Skin cancer: Discussed monitoring for atypical lesions  Colorectal cancer: scheduled for Feb 2026    Lung cancer:  Low Dose CT Chest recommended if Age 55-80 years, 20 pack-year currently smoking OR have quit w/in 15years. Patient does not qualify for screen   ECG: 2025  Advanced Care Planning: A voluntary discussion about advance care planning including the explanation and discussion of advance directives.  Discussed health care proxy and Living will, and the patient was able to identify a health care proxy as  daughter GLENWOOD Sora .  Patient does not have a living will and power of attorney of health care   Patient Active Problem List   Diagnosis Date Noted   Frequent PVCs 11/16/2024   Statin myopathy 12/03/2022   Atherosclerosis of aorta 02/23/2022   Coronary artery disease involving native coronary artery of native heart without angina pectoris 02/23/2022   Microalbuminuria 02/23/2022   Pure hypercholesterolemia 02/23/2022   Hallux valgus, acquired, bilateral 04/30/2020   Benign neoplasm of ascending colon    Osteoporosis 11/25/2016   Fever blister 09/16/2016   History of hysterectomy for benign disease 09/16/2016   Menopause syndrome 09/16/2016    Past Surgical History:  Procedure Laterality Date   ABDOMINAL HYSTERECTOMY     bladder tack     BUNIONECTOMY Right 09/16/2022   Procedure: LAPIDUS TYPE;  Surgeon: Ashley Soulier, DPM;  Location: [facility];  Service: Podiatry;  Laterality: Right;   BUNIONECTOMY Left 01/12/2024   Procedure: BUNIONECTOMY LAPIDUS TYPE;  Surgeon: Ashley Soulier, DPM;  Location: [facility];  Service:  Orthopedics/Podiatry;  Laterality: Left;   CATARACT EXTRACTION W/ INTRAOCULAR LENS  IMPLANT, BILATERAL     CATARACT EXTRACTION, BILATERAL     COLONOSCOPY WITH PROPOFOL  N/A 11/21/2018   Procedure: COLONOSCOPY WITH BIOPSIES;  Surgeon: Jinny Carmine, MD;  Location: [facility];  Service: Endoscopy;  Laterality: N/A;   EXCISION PARTIAL PHALANX Right 09/16/2022   Procedure: EXCISION PARTIAL PHALANX - SECOND;  Surgeon: Ashley Soulier, DPM;  Location: [facility];  Service: Podiatry;  Laterality: Right;   EYE SURGERY     Torn retina   FRACTURE SURGERY Bilateral    plate left wrist   LEFT HEART CATH AND CORONARY ANGIOGRAPHY Left 09/18/2024   Procedure: LEFT HEART CATH AND CORONARY ANGIOGRAPHY;  Surgeon: Florencio Cara BIRCH, MD;  Location: [facility];  Service: Cardiovascular;  Laterality: Left;   OOPHORECTOMY     POLYPECTOMY N/A 11/21/2018   Procedure: POLYPECTOMY;  Surgeon: Jinny Carmine, MD;  Location: [facility];  Service: Endoscopy;  Laterality: N/A;   TUBAL LIGATION      Family History  Problem Relation Age of Onset   Cirrhosis Mother    Lung cancer Father    Cancer Father    Cancer Sister        ovarian   Ovarian cancer Sister    Kidney disease Sister    Cirrhosis Sister    COPD Sister    Peripheral Artery Disease Sister    Cancer Sister 44       pancreatic cancer   Cancer Brother        throat and lung   Colon cancer Maternal Aunt    Breast cancer Maternal Aunt        3 mat aunts   Cancer Maternal Aunt    Breast cancer Maternal Aunt    Cancer Maternal Aunt    Colon cancer Maternal Uncle    Breast cancer Other     Social History   Socioeconomic History   Marital status: Widowed    Spouse name: Not on file   Number of children: 2   Years of education: Not on file   Highest education level: Associate degree: academic program  Occupational History   Occupation: sports administrator   Tobacco Use   Smoking status: Never   Smokeless  tobacco: Never  Vaping Use   Vaping status: Never Used  Substance and Sexual Activity   Alcohol use: No   Drug use: No   Sexual activity: Not Currently  Other Topics Concern   Not on file  Social History Narrative   She lost her second husband March 2017 from complications of PVD ( they were married for 16 years )   Two children from the first marriage   Lives alone   Social Drivers of Health   Tobacco Use: Low Risk (12/07/2024)   Patient History    Smoking Tobacco Use: Never    Smokeless Tobacco Use: Never    Passive Exposure: Not on file  Financial Resource Strain: Low Risk (11/15/2024)   Overall Financial Resource Strain (CARDIA)    Difficulty of Paying Living Expenses: Not very hard  Food Insecurity: No Food Insecurity (11/15/2024)   Epic    Worried About Programme Researcher, Broadcasting/film/video in the Last Year: Never true    Ran Out of Food in the Last Year: Never true  Transportation Needs: No Transportation Needs (11/15/2024)   Epic    Lack of Transportation (Medical): No    Lack of Transportation (Non-Medical): No  Physical Activity: Insufficiently Active (11/15/2024)   Exercise Vital Sign    Days of Exercise per Week: 4 days    Minutes of Exercise per Session: 30 min  Stress: No Stress Concern Present (11/15/2024)   [employer] of Occupational Health - Occupational Stress Questionnaire    Feeling of Stress: Not at all  Social Connections: Moderately Integrated (11/15/2024)   Social Connection and Isolation Panel    Frequency of Communication with Friends and Family: More than three times a week    Frequency of Social Gatherings with Friends and Family: Twice a week    Attends Religious Services: More than 4 times per year    Active Member of [employer] or Organizations: Yes    Attends Banker Meetings: More than 4 times per year    Marital Status: Widowed  Intimate Partner Violence: Not At Risk (03/16/2024)   Humiliation, Afraid, Rape, and Kick questionnaire  Fear  of Current or Ex-Partner: No    Emotionally Abused: No    Physically Abused: No    Sexually Abused: No  Depression (PHQ2-9): Low Risk (12/07/2024)   Depression (PHQ2-9)    PHQ-2 Score: 0  Alcohol Screen: Low Risk (03/16/2024)   Alcohol Screen    Last Alcohol Screening Score (AUDIT): 0  Housing: Unknown (11/15/2024)   Epic    Unable to Pay for Housing in the Last Year: No    Number of Times Moved in the Last Year: Not on file    Homeless in the Last Year: No  Utilities: Not At Risk (09/12/2024)   Received from [facility]   Epic    In the past 12 months has the electric, gas, oil, or water  company threatened to shut off services in your home?: No  Health Literacy: Adequate Health Literacy (03/16/2024)   B1300 Health Literacy    Frequency of need for help with medical instructions: Never    Current Medications[1]  Allergies[2]   ROS  Constitutional: Negative for fever , positive for mild weight change.  Respiratory: Negative for cough and shortness of breath.   Cardiovascular: Negative for chest pain or palpitations.  Gastrointestinal: Negative for abdominal pain, no bowel changes.  Musculoskeletal: Negative for gait problem or joint swelling.  Skin: Negative for rash.  Neurological: Negative for dizziness or headache.  No other specific complaints in a complete review of systems (except as listed in HPI above).   Objective  Vitals:   12/07/24 0843  BP: 128/70  Pulse: 88  Resp: 16  SpO2: 93%  Weight: 147 lb 9.6 oz (67 kg)  Height: 5' 4 (1.626 m)    Body mass index is 25.34 kg/m.  Physical Exam  Constitutional: Patient appears well-developed and well-nourished. No distress.  HENT: Head: Normocephalic and atraumatic. Ears: B TMs ok, no erythema or effusion; Nose: Nose normal. Mouth/Throat: Oropharynx is clear and moist. No oropharyngeal exudate.  Eyes: Conjunctivae and EOM are normal. Pupils are equal, round, and reactive to light. No scleral  icterus.  Neck: Normal range of motion. Neck supple. No JVD present. No thyromegaly present.  Cardiovascular: Normal rate, regular rhythm and normal heart sounds.  No murmur heard. No BLE edema. Pulmonary/Chest: Effort normal and breath sounds normal. No respiratory distress. Abdominal: Soft. Bowel sounds are normal, no distension. There is no tenderness. no masses Breast: no lumps or masses, no nipple discharge or rashes FEMALE GENITALIA:  Not done RECTAL: not done  Musculoskeletal: Normal range of motion, no joint effusions. No gross deformities Neurological: he is alert and oriented to person, place, and time. No cranial nerve deficit. Coordination, balance, strength, speech and gait are normal.  Skin: Skin is warm and dry. No rash noted. No erythema.  Psychiatric: Patient has a normal mood and affect. behavior is normal. Judgment and thought content normal.      Assessment & Plan Adult Wellness Visit Routine wellness visit with well-controlled blood pressure and BMI at 25. Weight stable at 147 lbs. Regular exercise maintained. No Pap smear needed due to hysterectomy and age. Family history of breast cancer noted, genetic testing normal. Mammogram normal in November. - Continue current diet and exercise regimen. - Ensure annual mammogram. - Maintain regular dental and eye exams.  Chronic systolic heart failure, NYHA class I Chronic systolic heart failure with mild left ventricular systolic dysfunction. Ejection fraction 45-50%. Managed with metoprolol , symptoms improved. No significant blockages in recent cardiac catheterization. - Continue metoprolol  25  mg daily. - Continue follow-up with cardiologist.  Atherosclerosis of aorta Atherosclerosis of the aorta with 25% stenosis in the LAD. Managed with Zetia  due to statin intolerance. - Continue Zetia  for cholesterol management.  Age-related osteoporosis Managed with alendronate . Bone density last checked in April. Plan to recheck  in 12 months due to recent initiation of alendronate . - Ordered bone density scan in 12 months.  Pure hypercholesterolemia Managed with Zetia . LDL cholesterol slightly above goal at 100 mg/dL. Statins not tolerated. - Continue Zetia  for cholesterol management.  Personal history of basal cell carcinoma Basal cell carcinoma on the face, managed by dermatologist. Regular dermatology follow-ups maintained. - Continue regular dermatology follow-ups.       -USPSTF grade A and B recommendations reviewed with patient; age-appropriate recommendations, preventive care, screening tests, etc discussed and encouraged; healthy living encouraged; see AVS for patient education given to patient -Discussed importance of 150 minutes of physical activity weekly, eat two servings of fish weekly, eat one serving of tree nuts ( cashews, pistachios, pecans, almonds.SABRA) every other day, eat 6 servings of fruit/vegetables daily and drink plenty of water  and avoid sweet beverages.   -Reviewed Health Maintenance: Yes.       [1]  Current Outpatient Medications:    alendronate  (FOSAMAX ) 70 MG tablet, Take 1 tablet (70 mg total) by mouth every 7 (seven) days. Take with a full glass of water  on an empty stomach., Disp: 12 tablet, Rfl: 3   aspirin  EC 81 MG tablet, Take 81 mg by mouth daily. Swallow whole., Disp: , Rfl:    Cholecalciferol (VITAMIN D ) 50 MCG (2000 UT) CAPS, Take 2,000 Units by mouth daily., Disp: , Rfl:    ezetimibe  (ZETIA ) 10 MG tablet, Take 1 tablet (10 mg total) by mouth daily., Disp: 90 tablet, Rfl: 3   latanoprost (XALATAN) 0.005 % ophthalmic solution, Place 1 drop into both eyes at bedtime., Disp: , Rfl:    metoprolol  succinate (TOPROL -XL) 25 MG 24 hr tablet, Take 1 tablet (25 mg total) by mouth daily., Disp: 30 tablet, Rfl: 0 [2]  Allergies Allergen Reactions   Pravastatin     Other Reaction(s): Muscle Pain   Penicillins Rash

## 2024-12-13 DIAGNOSIS — L570 Actinic keratosis: Secondary | ICD-10-CM | POA: Diagnosis not present

## 2025-01-30 ENCOUNTER — Telehealth: Payer: Self-pay

## 2025-02-06 ENCOUNTER — Ambulatory Visit: Admission: RE | Admit: 2025-02-06 | Source: Home / Self Care | Admitting: Gastroenterology

## 2025-02-06 ENCOUNTER — Encounter: Admission: RE | Payer: Self-pay | Source: Home / Self Care

## 2025-03-22 ENCOUNTER — Ambulatory Visit

## 2025-12-11 ENCOUNTER — Encounter: Admitting: Family Medicine
